# Patient Record
Sex: Female | Born: 1970
Health system: Southern US, Community
[De-identification: ages and names within clinical notes are randomized; demographics above are authoritative.]

## PROBLEM LIST (undated history)

## (undated) DIAGNOSIS — G43909 Migraine, unspecified, not intractable, without status migrainosus: Secondary | ICD-10-CM

## (undated) DIAGNOSIS — K219 Gastro-esophageal reflux disease without esophagitis: Secondary | ICD-10-CM

## (undated) DIAGNOSIS — J45909 Unspecified asthma, uncomplicated: Secondary | ICD-10-CM

## (undated) DIAGNOSIS — I10 Essential (primary) hypertension: Secondary | ICD-10-CM

## (undated) HISTORY — PX: ABDOMINAL HYSTERECTOMY: SHX81

## (undated) HISTORY — PX: ABLATION: SHX5711

## (undated) HISTORY — PX: FOOT SURGERY: SHX648

## (undated) HISTORY — PX: ROTATOR CUFF REPAIR: SHX139

## (undated) HISTORY — PX: TUBAL LIGATION: SHX77

## (undated) HISTORY — PX: LAPAROSCOPIC GASTRIC SLEEVE RESECTION: SHX5895

## (undated) HISTORY — PX: CHOLECYSTECTOMY: SHX55

---

## 2006-08-25 ENCOUNTER — Ambulatory Visit (HOSPITAL_COMMUNITY): Admission: RE | Admit: 2006-08-25 | Discharge: 2006-08-26 | Payer: Self-pay | Admitting: Orthopedic Surgery

## 2010-07-30 NOTE — Op Note (Signed)
Kerry Sloan, Kerry Sloan                ACCOUNT NO.:  [id number]   MEDICAL RECORD NO.:  [id number]          PATIENT TYPE:  OIB   LOCATION:  5035                         FACILITY:  [facility]   PHYSICIAN:  Feliberto Gottron. Turner Daniels, M.D.   DATE OF BIRTH:  29-Apr-1970   DATE OF PROCEDURE:  08/25/2006  DATE OF DISCHARGE:                               OPERATIVE REPORT   PREOPERATIVE DIAGNOSES:  1. 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  2. Partial thickness rotator cuff tear.   POSTOPERATIVE DIAGNOSES:  1. 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  2. Partial thickness rotator cuff tear.   PROCEDURE:  1. Left shoulder arthroscopic anterior-inferior acromioplasty.  2. Distal clavicle spur excision and debridement of external leaflet.  3. Partial thickness rotator cuff tear, maybe 10% of the fiber.   SURGEON:  Feliberto Gottron. Turner Daniels, M.D.   FIRST ASSISTANT:  None.   ANESTHESIA:  General endotracheal anesthesia.   ESTIMATED BLOOD LOSS:  Minimal.   FLUID REPLACEMENT:  1 L crystalloid.   DRAINS PLACED:  None.   TOURNIQUET TIME:  None.   INDICATIONS FOR PROCEDURE:  The patient is a 40 year old woman who was  initially seen by one my partners, Dr. Alveda Reasons for neck and shoulder pain.  Had a  differential injection into her left shoulder which gave her  dramatic pain relief for a short period of time.  Her neck did have some  degenerative changes in the cervical spine, but these were not felt to  be the major cause of her pain.  I saw her in the office and MRI scan  had been accomplished showing rotator cuff tendinosis, subacromial spur  that was fairly impressive actually, and a distal clavicle spur as well.  The rotator cuff had some evidence of partial thickness tearing along  the supraspinatus.  Options were discussed with the patient.  She  elected to have arthroscopic decompression of the left shoulder.  Risks  and  benefits of surgery were discussed.  She weighs about 285 pounds and  because of this, the surgery was done at the main hospital for her body  habitus.  She also a positive sleep study although she cannot tolerate  CPAP.   DESCRIPTION OF PROCEDURE:  The patient was identified by armband, taken  the operating room at [facility].  Appropriate anesthetic  monitors were attached and general endotracheal anesthesia induced with  the patient in the supine position.  She was then placed in the beach  chair position, left upper extremity prepped and draped in the usual  sterile fashion from the wrist to the hemithorax.  The skin along the  anterolateral posterior aspects of the acromion process was infiltrated  with 10 mL of 0.5%  Marcaine solution and then using a #11 blade,  standard portals were made 1.5 cm anterior to the [facility] joint lateral to  the junction of the middle and posterior thirds of the acromion,  posterior and posterolateral corner of the acromion process.  The inflow  was  placed anteriorly, the arthroscope laterally and a 4.2 great white  sucker shaver posteriorly.  Subacromial bursectomy was accomplished  outlining the subacromial spur and subclavicular spurs which were then  removed with a 4.5 hooded vortex bur.  We then visualized the external  leaflets of the rotator cuff which had some evidence of roughening, but  no full-thickness tearing.  About 5% of those fibers were debrided off  the insertion at the supraspinatus tubercle.  The arthroscope was then  repositioned into the glenohumeral joint where we documented an intact  biceps, biceps anchor, subscapularis, supraspinatus, and infraspinatus  insertions, and the articular cartilage were in good condition.  One  small loose body was removed from the glenohumeral joint that appeared  to be either a small piece of cartilage or some fibrous tissue.  No  donor site was located.  Photographic documentation was made of  these  findings and at this point the arthroscopic instruments were removed and  a dressing of Xeroform, 4x4 dressing sponges, ABD, and paper tape  applied.  The patient was laid supine, awakened and taken to recovery  room without difficulty.  She did receive 2 grams of Ancef IV  preoperatively      Feliberto Gottron. Turner Daniels, M.D.  Electronically Signed     FJR/MEDQ  D:  08/25/2006  T:  08/26/2006  Job:  [id number]

## 2011-01-02 LAB — BASIC METABOLIC PANEL
Chloride: 105
GFR calc Af Amer: >60
GFR calc non Af Amer: >60
Potassium: 3.9
Sodium: 137

## 2011-01-02 LAB — CBC
HCT: 35.3 — ABNORMAL LOW
MCV: 89.8
RBC: 3.93
WBC: 5

## 2011-01-02 LAB — APTT: aPTT: 30

## 2013-05-31 ENCOUNTER — Encounter (HOSPITAL_BASED_OUTPATIENT_CLINIC_OR_DEPARTMENT_OTHER): Payer: Self-pay | Admitting: Emergency Medicine

## 2013-05-31 ENCOUNTER — Emergency Department (HOSPITAL_BASED_OUTPATIENT_CLINIC_OR_DEPARTMENT_OTHER)
Admission: EM | Admit: 2013-05-31 | Discharge: 2013-06-01 | Disposition: A | Discharge status: Home / Self Care | Payer: No Typology Code available for payment source | Attending: Emergency Medicine | Admitting: Emergency Medicine

## 2013-05-31 DIAGNOSIS — S4980XA Other specified injuries of shoulder and upper arm, unspecified arm, initial encounter: Secondary | ICD-10-CM | POA: Insufficient documentation

## 2013-05-31 DIAGNOSIS — Y9389 Activity, other specified: Secondary | ICD-10-CM | POA: Insufficient documentation

## 2013-05-31 DIAGNOSIS — I10 Essential (primary) hypertension: Secondary | ICD-10-CM | POA: Insufficient documentation

## 2013-05-31 DIAGNOSIS — Z79899 Other long term (current) drug therapy: Secondary | ICD-10-CM | POA: Insufficient documentation

## 2013-05-31 DIAGNOSIS — Y92410 Unspecified street and highway as the place of occurrence of the external cause: Secondary | ICD-10-CM | POA: Insufficient documentation

## 2013-05-31 DIAGNOSIS — S8001XA Contusion of right knee, initial encounter: Secondary | ICD-10-CM

## 2013-05-31 DIAGNOSIS — J45909 Unspecified asthma, uncomplicated: Secondary | ICD-10-CM | POA: Insufficient documentation

## 2013-05-31 DIAGNOSIS — S46909A Unspecified injury of unspecified muscle, fascia and tendon at shoulder and upper arm level, unspecified arm, initial encounter: Secondary | ICD-10-CM | POA: Insufficient documentation

## 2013-05-31 DIAGNOSIS — K219 Gastro-esophageal reflux disease without esophagitis: Secondary | ICD-10-CM | POA: Insufficient documentation

## 2013-05-31 DIAGNOSIS — S46002A Unspecified injury of muscle(s) and tendon(s) of the rotator cuff of left shoulder, initial encounter: Secondary | ICD-10-CM

## 2013-05-31 DIAGNOSIS — Z9889 Other specified postprocedural states: Secondary | ICD-10-CM | POA: Insufficient documentation

## 2013-05-31 DIAGNOSIS — S8000XA Contusion of unspecified knee, initial encounter: Secondary | ICD-10-CM | POA: Insufficient documentation

## 2013-05-31 HISTORY — DX: Gastro-esophageal reflux disease without esophagitis: K21.9

## 2013-05-31 HISTORY — DX: Essential (primary) hypertension: I10

## 2013-05-31 HISTORY — DX: Unspecified asthma, uncomplicated: J45.909
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
## 2013-06-01 MED ORDER — NAPROXEN 250 MG PO TABS
500.0000 mg | ORAL_TABLET | Freq: Once | ORAL | Status: AC
  Administered 2013-06-01: 500 mg via ORAL
  Filled 2013-06-01: qty 2

## 2013-06-01 MED ORDER — HYDROCODONE-ACETAMINOPHEN 5-325 MG PO TABS: 1.0000 | ORAL_TABLET | Freq: Four times a day (QID) | ORAL | Status: DC | PRN
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
## 2013-12-11 ENCOUNTER — Encounter (HOSPITAL_BASED_OUTPATIENT_CLINIC_OR_DEPARTMENT_OTHER): Payer: Self-pay | Admitting: Emergency Medicine

## 2013-12-11 ENCOUNTER — Emergency Department (HOSPITAL_BASED_OUTPATIENT_CLINIC_OR_DEPARTMENT_OTHER)
Admission: EM | Admit: 2013-12-11 | Discharge: 2013-12-12 | Disposition: A | Discharge status: Home / Self Care | Payer: No Typology Code available for payment source | Attending: Emergency Medicine | Admitting: Emergency Medicine

## 2013-12-11 DIAGNOSIS — Z9851 Tubal ligation status: Secondary | ICD-10-CM | POA: Insufficient documentation

## 2013-12-11 DIAGNOSIS — Z9089 Acquired absence of other organs: Secondary | ICD-10-CM | POA: Diagnosis not present

## 2013-12-11 DIAGNOSIS — K219 Gastro-esophageal reflux disease without esophagitis: Secondary | ICD-10-CM | POA: Insufficient documentation

## 2013-12-11 DIAGNOSIS — Z79899 Other long term (current) drug therapy: Secondary | ICD-10-CM | POA: Diagnosis not present

## 2013-12-11 DIAGNOSIS — J45909 Unspecified asthma, uncomplicated: Secondary | ICD-10-CM | POA: Insufficient documentation

## 2013-12-11 DIAGNOSIS — R300 Dysuria: Secondary | ICD-10-CM | POA: Insufficient documentation

## 2013-12-11 DIAGNOSIS — I10 Essential (primary) hypertension: Secondary | ICD-10-CM | POA: Insufficient documentation

## 2013-12-11 DIAGNOSIS — Z9889 Other specified postprocedural states: Secondary | ICD-10-CM | POA: Diagnosis not present

## 2013-12-11 LAB — URINALYSIS, ROUTINE W REFLEX MICROSCOPIC
Bilirubin Urine: NEGATIVE
GLUCOSE, UA: NEGATIVE mg/dL
HGB URINE DIPSTICK: NEGATIVE
Ketones, ur: NEGATIVE mg/dL
Leukocytes, UA: NEGATIVE
Nitrite: NEGATIVE
PROTEIN: NEGATIVE mg/dL
Specific Gravity, Urine: 1.02 (ref 1.005–1.030)
Urobilinogen, UA: 1 mg/dL (ref 0.0–1.0)
pH: 8 (ref 5.0–8.0)

## 2013-12-11 LAB — PREGNANCY, URINE: Preg Test, Ur: NEGATIVE

## 2013-12-11 MED ORDER — CEPHALEXIN 500 MG PO CAPS
500.0000 mg | ORAL_CAPSULE | Freq: Four times a day (QID) | ORAL | Status: DC
Start: 2013-12-11 — End: 2015-05-27

## 2013-12-11 NOTE — ED Provider Notes (Signed)
CSN: [id number]     Arrival date & time 12/11/13  2041 History   First MD Initiated Contact with Patient 12/11/13 2217     Chief Complaint  Patient presents with  . Dysuria     (Consider location/radiation/quality/duration/timing/severity/associated sxs/prior Treatment) Patient is a 43 y.o. female presenting with dysuria. The history is provided by the patient. No language interpreter was used.  Dysuria Pain quality:  Unable to specify Pain severity:  Moderate Onset quality:  Gradual Duration:  1 day Timing:  Constant Progression:  Unchanged Chronicity:  New Recent urinary tract infections: no   Relieved by:  Nothing Worsened by:  Nothing tried Ineffective treatments:  None tried Urinary symptoms: frequent urination   Urinary symptoms: no discolored urine, no foul-smelling urine and no hematuria   Associated symptoms: no abdominal pain, no fever, no nausea, no vaginal discharge and no vomiting   Risk factors: sexually active   Risk factors: no hx of pyelonephritis, no hx of urolithiasis, no renal disease, no sexually transmitted infections and no urinary catheter     Past Medical History  Diagnosis Date  . Hypertension   . Asthma   . GERD (gastroesophageal reflux disease)    Past Surgical History  Procedure Laterality Date  . Cholecystectomy    . Tubal ligation    . Ablation    . Rotator cuff repair     No family history on file. History  Substance Use Topics  . Smoking status: Never Smoker   . Smokeless tobacco: Not on file  . Alcohol Use: Yes   OB History   Grav Para Term Preterm Abortions TAB SAB Ect Mult Living                 Review of Systems  Constitutional: Negative for fever, chills and fatigue.  HENT: Negative for trouble swallowing.   Eyes: Negative for visual disturbance.  Respiratory: Negative for shortness of breath.   Cardiovascular: Negative for chest pain and palpitations.  Gastrointestinal: Negative for nausea, vomiting, abdominal pain  and diarrhea.  Genitourinary: Positive for dysuria, urgency and frequency. Negative for vaginal discharge and difficulty urinating.  Musculoskeletal: Negative for arthralgias and neck pain.  Skin: Negative for color change.  Neurological: Negative for dizziness and weakness.  Psychiatric/Behavioral: Negative for dysphoric mood.      Allergies  Review of patient's allergies indicates no known allergies.  Home Medications   Prior to Admission medications   Medication Sig Start Date End Date Taking? Authorizing Provider  cetirizine (ZYRTEC) 10 MG tablet Take 10 mg by mouth daily.   Yes Historical Provider, MD  HYDROCHLOROTHIAZIDE PO Take by mouth.   Yes Historical Provider, MD  Montelukast Sodium (SINGULAIR PO) Take by mouth.   Yes Historical Provider, MD  omeprazole (PRILOSEC) 20 MG capsule Take 20 mg by mouth daily.   Yes Historical Provider, MD  HYDROcodone-acetaminophen (NORCO/VICODIN) 5-325 MG per tablet Take 1-2 tablets by mouth every 6 (six) hours as needed for moderate pain. 06/01/13   John L Molpus, MD  Lansoprazole (PREVACID PO) Take by mouth.    Historical Provider, MD   BP 152/89  Pulse 96  Temp(Src) 98.3 F (36.8 C) (Oral)  Resp 20  Ht  (1.651 m)  Wt 328 lb (148.78 kg)  BMI 54.58 kg/m2  SpO2 100%  LMP 12/04/2013 Physical Exam  Nursing note and vitals reviewed. Constitutional: She is oriented to person, place, and time. She appears well-developed and well-nourished. No distress.  HENT:  Head: Normocephalic and atraumatic.  Eyes: Conjunctivae are normal.  Neck: Normal range of motion.  Cardiovascular: Normal rate and regular rhythm.  Exam reveals no gallop and no friction rub.   No murmur heard. Pulmonary/Chest: Effort normal and breath sounds normal. She has no wheezes. She has no rales. She exhibits no tenderness.  Abdominal: Soft. She exhibits no distension. There is tenderness. There is no rebound.  Suprapubic tenderness to palpation.   Genitourinary:   No CVA tenderness.   Musculoskeletal: Normal range of motion.  Neurological: She is alert and oriented to person, place, and time. Coordination normal.  Speech is goal-oriented. Moves limbs without ataxia.   Skin: Skin is warm and dry.  Psychiatric: She has a normal mood and affect. Her behavior is normal.    ED Course  Procedures (including critical care time) Labs Review Labs Reviewed  URINE CULTURE  PREGNANCY, URINE  URINALYSIS, ROUTINE W REFLEX MICROSCOPIC    Imaging Review No results found.   EKG Interpretation None      MDM   Final diagnoses:  Dysuria   Patient's urinalysis shows no acute infection. Patient will have keflex per patient request. Vitals stable and patient afebrile.      Emilia Beck, [place] 12/13/13 [phone]

## 2013-12-11 NOTE — Discharge Instructions (Signed)
Take Keflex as directed for possible UTI. Refer to attached documents for more information. Return to the ED with worsening or concerning symptoms.

## 2013-12-11 NOTE — ED Notes (Signed)
PT presents to ED with complaints of painful urination that started today and lower abdominal pain

## 2013-12-13 NOTE — ED Provider Notes (Signed)
History/physical exam/procedure(s) were performed by non-physician practitioner and as supervising physician I was immediately available for consultation/collaboration. I have reviewed all notes and am in agreement with care and plan.   Hilario Quarryanielle S Morell Mears, MD 12/13/13 [phone]

## 2013-12-14 LAB — URINE CULTURE: Colony Count: 75000

## 2015-02-04 ENCOUNTER — Encounter (HOSPITAL_BASED_OUTPATIENT_CLINIC_OR_DEPARTMENT_OTHER): Payer: Self-pay | Admitting: Emergency Medicine

## 2015-02-04 ENCOUNTER — Emergency Department (HOSPITAL_BASED_OUTPATIENT_CLINIC_OR_DEPARTMENT_OTHER)
Admission: EM | Admit: 2015-02-04 | Discharge: 2015-02-04 | Disposition: A | Discharge status: Home / Self Care | Payer: No Typology Code available for payment source | Attending: Emergency Medicine | Admitting: Emergency Medicine

## 2015-02-04 DIAGNOSIS — Z79899 Other long term (current) drug therapy: Secondary | ICD-10-CM | POA: Insufficient documentation

## 2015-02-04 DIAGNOSIS — R51 Headache: Secondary | ICD-10-CM | POA: Insufficient documentation

## 2015-02-04 DIAGNOSIS — I10 Essential (primary) hypertension: Secondary | ICD-10-CM | POA: Insufficient documentation

## 2015-02-04 DIAGNOSIS — H109 Unspecified conjunctivitis: Secondary | ICD-10-CM | POA: Insufficient documentation

## 2015-02-04 DIAGNOSIS — K219 Gastro-esophageal reflux disease without esophagitis: Secondary | ICD-10-CM | POA: Insufficient documentation

## 2015-02-04 DIAGNOSIS — Z792 Long term (current) use of antibiotics: Secondary | ICD-10-CM | POA: Insufficient documentation

## 2015-02-04 DIAGNOSIS — J45909 Unspecified asthma, uncomplicated: Secondary | ICD-10-CM | POA: Insufficient documentation

## 2015-02-04 MED ORDER — GENTAMICIN SULFATE 0.3 % OP SOLN: 2.0000 [drp] | OPHTHALMIC | Status: DC

## 2015-02-04 NOTE — Discharge Instructions (Signed)
Gentamicin drops as prescribed.  Return to the emergency department if your symptoms significantly worsen or change.   Bacterial Conjunctivitis Bacterial conjunctivitis, commonly called pink eye, is an inflammation of the clear membrane that covers the white part of the eye (conjunctiva). The inflammation can also happen on the underside of the eyelids. The blood vessels in the conjunctiva become inflamed, causing the eye to become red or pink. Bacterial conjunctivitis may spread easily from one eye to another and from person to person (contagious).  CAUSES  Bacterial conjunctivitis is caused by bacteria. The bacteria may come from your own skin, your upper respiratory tract, or from someone else with bacterial conjunctivitis. SYMPTOMS  The normally white color of the eye or the underside of the eyelid is usually pink or red. The pink eye is usually associated with irritation, tearing, and some sensitivity to light. Bacterial conjunctivitis is often associated with a thick, yellowish discharge from the eye. The discharge may turn into a crust on the eyelids overnight, which causes your eyelids to stick together. If a discharge is present, there may also be some blurred vision in the affected eye. DIAGNOSIS  Bacterial conjunctivitis is diagnosed by your caregiver through an eye exam and the symptoms that you report. Your caregiver looks for changes in the surface tissues of your eyes, which may point to the specific type of conjunctivitis. A sample of any discharge may be collected on a cotton-tip swab if you have a severe case of conjunctivitis, if your cornea is affected, or if you keep getting repeat infections that do not respond to treatment. The sample will be sent to a lab to see if the inflammation is caused by a bacterial infection and to see if the infection will respond to antibiotic medicines. TREATMENT   Bacterial conjunctivitis is treated with antibiotics. Antibiotic eyedrops are most  often used. However, antibiotic ointments are also available. Antibiotics pills are sometimes used. Artificial tears or eye washes may ease discomfort. HOME CARE INSTRUCTIONS   To ease discomfort, apply a cool, clean washcloth to your eye for 10-20 minutes, 3-4 times a day.  Gently wipe away any drainage from your eye with a warm, wet washcloth or a cotton ball.  Wash your hands often with soap and water. Use paper towels to dry your hands.  Do not share towels or washcloths. This may spread the infection.  Change or wash your pillowcase every day.  You should not use eye makeup until the infection is gone.  Do not operate machinery or drive if your vision is blurred.  Stop using contact lenses. Ask your caregiver how to sterilize or replace your contacts before using them again. This depends on the type of contact lenses that you use.  When applying medicine to the infected eye, do not touch the edge of your eyelid with the eyedrop bottle or ointment tube. SEEK IMMEDIATE MEDICAL CARE IF:   Your infection has not improved within 3 days after beginning treatment.  You had yellow discharge from your eye and it returns.  You have increased eye pain.  Your eye redness is spreading.  Your vision becomes blurred.  You have a fever or persistent symptoms for more than 2-3 days.  You have a fever and your symptoms suddenly get worse.  You have facial pain, redness, or swelling. MAKE SURE YOU:   Understand these instructions.  Will watch your condition.  Will get help right away if you are not doing well or get worse.  This information is not intended to replace advice given to you by your health care provider. Make sure you discuss any questions you have with your health care provider.   Document Released: 03/03/2005 Document Revised: 03/24/2014 Document Reviewed: 08/04/2011 Elsevier Interactive Patient Education [employer]2016 [employer] Inc[employer]

## 2015-02-04 NOTE — ED Notes (Signed)
Patient states does wear corrective lenses, but forgot them this morning, stated she cannot see without them, then stated she drove herself to ED this a.m.

## 2015-02-04 NOTE — ED Notes (Signed)
Pt reports eye pain and tearing since yesterday. Denies feeling like anything is in her eye. No known contacts with eye infections. Normally wears glasses or contacts, but is not wearing either today. No edema, sclera red. No color to drainage.

## 2015-02-04 NOTE — ED Provider Notes (Signed)
CSN: [id number]     Arrival date & time 02/04/15  0631 History   First MD Initiated Contact with Patient 02/04/15 [phone]     Chief Complaint  Patient presents with  . Eye Pain     (Consider location/radiation/quality/duration/timing/severity/associated sxs/prior Treatment) HPI Comments: Patient is a 44 year old female with history of hypertension and asthma. She presents for evaluation of right eye redness and irritation that started yesterday. She reports having what she believed was a migraine prior to the onset. The headache has since resolved, however her eye is now red and irritated. She denies any visual disturbances.  Patient is a 44 y.o. female presenting with eye pain. The history is provided by the patient.  Eye Pain This is a new problem. The current episode started yesterday. The problem occurs constantly. The problem has been gradually worsening. Associated symptoms include headaches. Nothing aggravates the symptoms. She has tried nothing for the symptoms. The treatment provided no relief.    Past Medical History  Diagnosis Date  . Hypertension   . Asthma   . GERD (gastroesophageal reflux disease)    Past Surgical History  Procedure Laterality Date  . Cholecystectomy    . Tubal ligation    . Ablation    . Rotator cuff repair     No family history on file. Social History  Substance Use Topics  . Smoking status: Never Smoker   . Smokeless tobacco: None  . Alcohol Use: Yes   OB History    No data available     Review of Systems  Eyes: Positive for pain.  Neurological: Positive for headaches.  All other systems reviewed and are negative.     Allergies  Review of patient's allergies indicates no known allergies.  Home Medications   Prior to Admission medications   Medication Sig Start Date End Date Taking? Authorizing Provider  cephALEXin (KEFLEX) 500 MG capsule Take 1 capsule (500 mg total) by mouth 4 (four) times daily. 12/11/13  Yes Kaitlyn Szekalski,  PA-C  HYDROcodone-acetaminophen (NORCO/VICODIN) 5-325 MG per tablet Take 1-2 tablets by mouth every 6 (six) hours as needed for moderate pain. 06/01/13  Yes John Molpus, MD  Lansoprazole (PREVACID PO) Take by mouth.   Yes Historical Provider, MD  SUMAtriptan (IMITREX) 100 MG tablet Take 100 mg by mouth every 2 (two) hours as needed for migraine. May repeat in 2 hours if headache persists or recurs.   Yes Historical Provider, MD  cetirizine (ZYRTEC) 10 MG tablet Take 10 mg by mouth daily.    Historical Provider, MD  HYDROCHLOROTHIAZIDE PO Take by mouth.    Historical Provider, MD  Montelukast Sodium (SINGULAIR PO) Take by mouth.    Historical Provider, MD  omeprazole (PRILOSEC) 20 MG capsule Take 20 mg by mouth daily.    Historical Provider, MD   BP 137/84 mmHg  Pulse 81  Temp(Src) 97.6 F (36.4 C) (Oral)  Resp 20  Ht 5\' 5"  (1.651 m)  Wt 264 lb (119.75 kg)  BMI 43.93 kg/m2  SpO2 100%  LMP 02/02/2015 Physical Exam  Constitutional: She is oriented to person, place, and time. She appears well-developed and well-nourished. No distress.  HENT:  Head: Normocephalic and atraumatic.  Mouth/Throat: Oropharynx is clear and moist.  Eyes: EOM are normal. Pupils are equal, round, and reactive to light.  The right cornea appears clear. The conjunctiva is injected with slight discharge.  Neck: Normal range of motion. Neck supple.  Neurological: She is alert and oriented to person, place, and time.  Skin: Skin is warm and dry. She is not diaphoretic.  Nursing note and vitals reviewed.   ED Course  Procedures (including critical care time) Labs Review Labs Reviewed - No data to display  Imaging Review No results found. I have personally reviewed and evaluated these images and lab results as part of my medical decision-making.   EKG Interpretation None      MDM   Final diagnoses:  None    This appears to be a conjunctivitis. I will treat with gentamicin drops. She is to follow-up as  needed if she worsens.    Geoffery Lyons, MD 02/04/15 [phone]

## 2015-02-04 NOTE — ED Notes (Signed)
Pt teaching done re: using eye Rx as prescribed by EDP, enforced strict handwashing before and after using eye gtts. Pt able to return demonstration on eye med usage

## 2015-03-01 ENCOUNTER — Emergency Department (HOSPITAL_BASED_OUTPATIENT_CLINIC_OR_DEPARTMENT_OTHER)
Admission: EM | Admit: 2015-03-01 | Discharge: 2015-03-01 | Disposition: A | Discharge status: Home / Self Care | Payer: No Typology Code available for payment source | Attending: Emergency Medicine | Admitting: Emergency Medicine

## 2015-03-01 ENCOUNTER — Encounter (HOSPITAL_BASED_OUTPATIENT_CLINIC_OR_DEPARTMENT_OTHER): Payer: Self-pay | Admitting: *Deleted

## 2015-03-01 DIAGNOSIS — K219 Gastro-esophageal reflux disease without esophagitis: Secondary | ICD-10-CM | POA: Insufficient documentation

## 2015-03-01 DIAGNOSIS — Z79899 Other long term (current) drug therapy: Secondary | ICD-10-CM | POA: Insufficient documentation

## 2015-03-01 DIAGNOSIS — I10 Essential (primary) hypertension: Secondary | ICD-10-CM | POA: Insufficient documentation

## 2015-03-01 DIAGNOSIS — J45909 Unspecified asthma, uncomplicated: Secondary | ICD-10-CM | POA: Insufficient documentation

## 2015-03-01 DIAGNOSIS — M5431 Sciatica, right side: Secondary | ICD-10-CM | POA: Insufficient documentation

## 2015-03-01 DIAGNOSIS — Z792 Long term (current) use of antibiotics: Secondary | ICD-10-CM | POA: Insufficient documentation

## 2015-03-01 MED ORDER — PREDNISONE 10 MG (21) PO TBPK: 10.0000 mg | ORAL_TABLET | Freq: Every day | ORAL | Status: DC

## 2015-03-01 MED ORDER — IBUPROFEN 800 MG PO TABS: 800.0000 mg | ORAL_TABLET | Freq: Three times a day (TID) | ORAL | Status: DC

## 2015-03-01 MED ORDER — DEXAMETHASONE SODIUM PHOSPHATE 10 MG/ML IJ SOLN
10.0000 mg | Freq: Once | INTRAMUSCULAR | Status: AC
  Administered 2015-03-01: 10 mg via INTRAMUSCULAR
  Filled 2015-03-01: qty 1

## 2015-03-01 MED ORDER — HYDROCODONE-ACETAMINOPHEN 5-325 MG PO TABS: 2.0000 | ORAL_TABLET | ORAL | Status: DC | PRN

## 2015-03-01 MED ORDER — KETOROLAC TROMETHAMINE 60 MG/2ML IM SOLN
60.0000 mg | Freq: Once | INTRAMUSCULAR | Status: AC
  Administered 2015-03-01: 60 mg via INTRAMUSCULAR
  Filled 2015-03-01: qty 2

## 2015-03-01 NOTE — ED Notes (Signed)
Lower back pain radiating into her right leg. Same for a week. Not getting better.

## 2015-03-01 NOTE — Discharge Instructions (Signed)
Ms. Lavonia DraftsDana Vargo,  Nice meeting you! Please follow-up with your primary care provider as needed or if symptoms worsen. Return to the emergency department if you develop fevers, inability to walk, lose control of your bowel or bladder control. Feel better soon!  S. Lane HackerNicole Krystyne Tewksbury, PA-C

## 2015-03-01 NOTE — ED Provider Notes (Signed)
CSN: [id number]     Arrival date & time 03/01/15  1237 History   First MD Initiated Contact with Patient 03/01/15 1246     Chief Complaint: Back pain HPI Kerry Sloan is a 44 y.o. F PMH significant for HTN presenting with a one week history of lower back pain. She describes the pain as intermittent, worse with sitting, 9/10 pain scale, sharp, radiating down her right leg. She denies fevers, chills, history of this before, injury, loss of bladder/bowel control.   Past Medical History  Diagnosis Date  . Hypertension   . Asthma   . GERD (gastroesophageal reflux disease)    Past Surgical History  Procedure Laterality Date  . Cholecystectomy    . Tubal ligation    . Ablation    . Rotator cuff repair     No family history on file. Social History  Substance Use Topics  . Smoking status: Never Smoker   . Smokeless tobacco: None  . Alcohol Use: Yes   OB History    No data available     Review of Systems  Ten systems are reviewed and are negative for acute change except as noted in the HPI  Allergies  Review of patient's allergies indicates no known allergies.  Home Medications   Prior to Admission medications   Medication Sig Start Date End Date Taking? Authorizing Provider  cephALEXin (KEFLEX) 500 MG capsule Take 1 capsule (500 mg total) by mouth 4 (four) times daily. 12/11/13   Kaitlyn Szekalski, PA-C  cetirizine (ZYRTEC) 10 MG tablet Take 10 mg by mouth daily.    Historical Provider, MD  gentamicin (GARAMYCIN) 0.3 % ophthalmic solution Place 2 drops into both eyes every 4 (four) hours. 02/04/15   Veryl Speak, MD  HYDROCHLOROTHIAZIDE PO Take by mouth.    Historical Provider, MD  HYDROcodone-acetaminophen (NORCO/VICODIN) 5-325 MG tablet Take 2 tablets by mouth every 4 (four) hours as needed. 03/01/15   Titus Lions, PA-C  ibuprofen (ADVIL,MOTRIN) 800 MG tablet Take 1 tablet (800 mg total) by mouth 3 (three) times daily with meals. 03/01/15   [place] Lions, PA-C   Lansoprazole (PREVACID PO) Take by mouth.    Historical Provider, MD  Montelukast Sodium (SINGULAIR PO) Take by mouth.    Historical Provider, MD  omeprazole (PRILOSEC) 20 MG capsule Take 20 mg by mouth daily.    Historical Provider, MD  predniSONE (STERAPRED UNI-PAK 21 TAB) 10 MG (21) TBPK tablet Take 1 tablet (10 mg total) by mouth daily. Take 6 tabs by mouth daily  for 2 days, then 5 tabs for 2 days, then 4 tabs for 2 days, then 3 tabs for 2 days, 2 tabs for 2 days, then 1 tab by mouth daily for 2 days 03/01/15   [place] Lions, PA-C  SUMAtriptan (IMITREX) 100 MG tablet Take 100 mg by mouth every 2 (two) hours as needed for migraine. May repeat in 2 hours if headache persists or recurs.    Historical Provider, MD   BP 124/80 mmHg  Pulse 70  Temp(Src) 98.5 F (36.9 C) (Oral)  Resp 12  Ht 5' 5"  (1.651 m)  Wt 119.75 kg  BMI 43.93 kg/m2  SpO2 100%  LMP 02/02/2015 Physical Exam  Constitutional: She appears well-developed and well-nourished. No distress.  HENT:  Head: Normocephalic and atraumatic.  Mouth/Throat: Oropharynx is clear and moist. No oropharyngeal exudate.  Eyes: Conjunctivae are normal. Pupils are equal, round, and reactive to light. Right eye exhibits no discharge. Left eye exhibits  no discharge. No scleral icterus.  Neck: No tracheal deviation present.  Cardiovascular: Normal rate, regular rhythm, normal heart sounds and intact distal pulses.  Exam reveals no gallop and no friction rub.   No murmur heard. Pulmonary/Chest: Effort normal and breath sounds normal. No respiratory distress. She has no wheezes. She has no rales. She exhibits no tenderness.  Abdominal: Soft. Bowel sounds are normal. She exhibits no distension and no mass. There is no tenderness. There is no rebound and no guarding.  Musculoskeletal: Normal range of motion. She exhibits tenderness. She exhibits no edema.  Patient neurovascularly intact bilaterally. Tenderness over right SI joint   Lymphadenopathy:    She has no cervical adenopathy.  Neurological: She is alert. Coordination normal.  Skin: Skin is warm and dry. No rash noted. She is not diaphoretic. No erythema.  Psychiatric: She has a normal mood and affect. Her behavior is normal.  Nursing note and vitals reviewed.   ED Course  Procedures   MDM   Final diagnoses:  Sciatica of right side   Patient non-toxic appearing and VSS. Based on patient history and physical exam, most likely etiologies include sciatica vs strain. Less likely etiologies include ruptured AAA, pancreatitis, cholecystitis, ulcer, vertebral compression fracture, retroperitoneal bleed, spinal epidural abscess, discitis, osteomyelitis, pyelonephritis/perinephric abscess, cauda equina syndrome, herniated disc, spinal stenosis, fibromyalgia, rheumatoid arthritis, spondylitis, osteoarthritis, nephrolithiasis, multiple myeloma, bony metastasis. Imaging not indicated at this time.   Upon reevaluation, patient improved after toradol and decadron. Patient may be safely discharged home. Discussed reasons for return. Patient to follow-up with primary care provider or orthopedics within two weeks if symptoms do not improve. Patient in understanding and agreement with the plan.  [place] Lions, PA-C 03/04/15 4098  Sherwood Gambler, MD 03/09/15 1400

## 2015-05-27 ENCOUNTER — Emergency Department (HOSPITAL_BASED_OUTPATIENT_CLINIC_OR_DEPARTMENT_OTHER): Payer: No Typology Code available for payment source

## 2015-05-27 ENCOUNTER — Encounter (HOSPITAL_BASED_OUTPATIENT_CLINIC_OR_DEPARTMENT_OTHER): Payer: Self-pay

## 2015-05-27 ENCOUNTER — Emergency Department (HOSPITAL_BASED_OUTPATIENT_CLINIC_OR_DEPARTMENT_OTHER)
Admission: EM | Admit: 2015-05-27 | Discharge: 2015-05-27 | Disposition: A | Discharge status: Home / Self Care | Payer: No Typology Code available for payment source | Attending: Emergency Medicine | Admitting: Emergency Medicine

## 2015-05-27 DIAGNOSIS — S3992XA Unspecified injury of lower back, initial encounter: Secondary | ICD-10-CM | POA: Diagnosis not present

## 2015-05-27 DIAGNOSIS — M545 Low back pain, unspecified: Secondary | ICD-10-CM

## 2015-05-27 DIAGNOSIS — J45909 Unspecified asthma, uncomplicated: Secondary | ICD-10-CM | POA: Diagnosis not present

## 2015-05-27 DIAGNOSIS — Z79899 Other long term (current) drug therapy: Secondary | ICD-10-CM | POA: Diagnosis not present

## 2015-05-27 DIAGNOSIS — S8992XA Unspecified injury of left lower leg, initial encounter: Secondary | ICD-10-CM | POA: Insufficient documentation

## 2015-05-27 DIAGNOSIS — M25562 Pain in left knee: Secondary | ICD-10-CM

## 2015-05-27 DIAGNOSIS — Y9389 Activity, other specified: Secondary | ICD-10-CM | POA: Insufficient documentation

## 2015-05-27 DIAGNOSIS — Y998 Other external cause status: Secondary | ICD-10-CM | POA: Insufficient documentation

## 2015-05-27 DIAGNOSIS — I10 Essential (primary) hypertension: Secondary | ICD-10-CM | POA: Insufficient documentation

## 2015-05-27 DIAGNOSIS — K219 Gastro-esophageal reflux disease without esophagitis: Secondary | ICD-10-CM | POA: Diagnosis not present

## 2015-05-27 DIAGNOSIS — Y92410 Unspecified street and highway as the place of occurrence of the external cause: Secondary | ICD-10-CM | POA: Diagnosis not present

## 2015-05-27 MED ORDER — IBUPROFEN 800 MG PO TABS: 800.0000 mg | ORAL_TABLET | Freq: Three times a day (TID) | ORAL | Status: DC

## 2015-05-27 MED ORDER — IBUPROFEN 800 MG PO TABS
800.0000 mg | ORAL_TABLET | Freq: Once | ORAL | Status: AC
  Administered 2015-05-27: 800 mg via ORAL
  Filled 2015-05-27: qty 1

## 2015-05-27 NOTE — Discharge Instructions (Signed)
Take her medication as prescribed. I recommend eating food prior to taking ibuprofen to prevent GI side effects. You may also apply ice and heat to affected area for 15-20 minutes 3-4 times daily for pain relief. I recommend refraining from doing any heavy lifting, exercising or repetitive movements that exacerbate her symptoms. Please follow up with a primary care provider from the Resource Guide provided below in 4-5 days as needed. Please return to the Emergency Department if symptoms worsen or new onset of fever, redness, swelling, and numbness, tingling, saddle anesthesia, loss of bowel or bladder, weakness.
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
## 2015-11-17 ENCOUNTER — Emergency Department (HOSPITAL_BASED_OUTPATIENT_CLINIC_OR_DEPARTMENT_OTHER)
Admission: EM | Admit: 2015-11-17 | Discharge: 2015-11-17 | Disposition: A | Discharge status: Home / Self Care | Payer: BLUE CROSS/BLUE SHIELD | Attending: Emergency Medicine | Admitting: Emergency Medicine

## 2015-11-17 ENCOUNTER — Encounter (HOSPITAL_BASED_OUTPATIENT_CLINIC_OR_DEPARTMENT_OTHER): Payer: Self-pay

## 2015-11-17 DIAGNOSIS — Z79899 Other long term (current) drug therapy: Secondary | ICD-10-CM | POA: Insufficient documentation

## 2015-11-17 DIAGNOSIS — G43909 Migraine, unspecified, not intractable, without status migrainosus: Secondary | ICD-10-CM | POA: Insufficient documentation

## 2015-11-17 DIAGNOSIS — Z7951 Long term (current) use of inhaled steroids: Secondary | ICD-10-CM | POA: Diagnosis not present

## 2015-11-17 DIAGNOSIS — J45909 Unspecified asthma, uncomplicated: Secondary | ICD-10-CM | POA: Insufficient documentation

## 2015-11-17 DIAGNOSIS — R110 Nausea: Secondary | ICD-10-CM | POA: Insufficient documentation

## 2015-11-17 DIAGNOSIS — I10 Essential (primary) hypertension: Secondary | ICD-10-CM | POA: Diagnosis not present

## 2015-11-17 HISTORY — DX: Migraine, unspecified, not intractable, without status migrainosus: G43.909

## 2015-11-17 MED ORDER — SODIUM CHLORIDE 0.9 % IV SOLN
INTRAVENOUS | Status: DC
  Administered 2015-11-17: 19:00:00 via INTRAVENOUS

## 2015-11-17 MED ORDER — KETOROLAC TROMETHAMINE 30 MG/ML IJ SOLN
30.0000 mg | Freq: Once | INTRAMUSCULAR | Status: AC
  Administered 2015-11-17: 30 mg via INTRAVENOUS
  Filled 2015-11-17: qty 1

## 2015-11-17 MED ORDER — DIPHENHYDRAMINE HCL 50 MG/ML IJ SOLN
12.5000 mg | Freq: Once | INTRAMUSCULAR | Status: AC
  Administered 2015-11-17: 12.5 mg via INTRAVENOUS
  Filled 2015-11-17: qty 1

## 2015-11-17 MED ORDER — SUMATRIPTAN SUCCINATE 100 MG PO TABS: 100.0000 mg | ORAL_TABLET | ORAL | 0 refills | Status: DC | PRN

## 2015-11-17 MED ORDER — PROCHLORPERAZINE EDISYLATE 5 MG/ML IJ SOLN
10.0000 mg | Freq: Once | INTRAMUSCULAR | Status: AC
  Administered 2015-11-17: 10 mg via INTRAVENOUS
  Filled 2015-11-17: qty 2

## 2015-11-17 NOTE — Discharge Instructions (Signed)
Follow up with your primary care doctor.

## 2015-11-17 NOTE — ED Notes (Signed)
Pt seen by EDP, and treated/ medicated prior to RN assessment, see MD notes, orders received and initiated. Care assumed at this time.

## 2015-11-17 NOTE — ED Provider Notes (Signed)
[facility] Note   CSN: [id number] Arrival date & time: 11/17/15  1756 By signing my name below, I, Levon Hedger, attest that this documentation has been prepared under the direction and in the presence of Linwood Dibbles, MD . Electronically Signed: Levon Hedger, Scribe. 11/17/2015. 6:51 PM.   History   Chief Complaint Chief Complaint  Patient presents with  . Migraine    HPI Kerry Sloan is a 45 y.o. female with hx of migraine and HTN who presents to the Emergency Department complaining of severe migraine onset last night. She rates her pain as 10/10.  Her symptoms are typical for her migraines. Pt states she is out of Imitrex which she typically takes for her migraines. Pt has appointment with PCP at [facility] in three days. She notes associated nausea, but denies vomiting, fevers, or chills. NKDA.   The history is provided by the patient. No language interpreter was used.    Past Medical History:  Diagnosis Date  . Asthma   . GERD (gastroesophageal reflux disease)   . Hypertension   . Migraine     There are no active problems to display for this patient.   Past Surgical History:  Procedure Laterality Date  . ABLATION    . CHOLECYSTECTOMY    . ROTATOR CUFF REPAIR    . TUBAL LIGATION      OB History    No data available      Home Medications    Prior to Admission medications   Medication Sig Start Date End Date Taking? Authorizing Provider  ALBUTEROL IN Inhale into the lungs.   Yes Historical Provider, MD  fluticasone (FLONASE) 50 MCG/ACT nasal spray Place into both nostrils daily.   Yes Historical Provider, MD  Topiramate (TOPAMAX PO) Take by mouth.   Yes Historical Provider, MD  cetirizine (ZYRTEC) 10 MG tablet Take 10 mg by mouth daily.    Historical Provider, MD  HYDROCHLOROTHIAZIDE PO Take by mouth.    Historical Provider, MD  ibuprofen (ADVIL,MOTRIN) 800 MG tablet Take 1 tablet (800 mg total) by mouth 3 (three) times daily. 05/27/15    Barrett Henle, PA-C  Montelukast Sodium (SINGULAIR PO) Take by mouth.    Historical Provider, MD  omeprazole (PRILOSEC) 20 MG capsule Take 20 mg by mouth daily.    Historical Provider, MD  SUMAtriptan (IMITREX) 100 MG tablet Take 1 tablet (100 mg total) by mouth every 2 (two) hours as needed for migraine. May repeat in 2 hours if headache persists or recurs. 11/17/15   Linwood Dibbles, MD    Family History No family history on file.  Social History Social History  Substance Use Topics  . Smoking status: Never Smoker  . Smokeless tobacco: Never Used  . Alcohol use Yes     Comment: occ     Allergies   Review of patient's allergies indicates no known allergies.   Review of Systems Review of Systems  Constitutional: Negative for chills and fever.  Gastrointestinal: Positive for nausea. Negative for vomiting.  Neurological: Positive for headaches.  All other systems reviewed and are negative.  Physical Exam Updated Vital Signs BP 144/85 (BP Location: Left Arm)   Pulse 79   Temp 98.4 F (36.9 C) (Oral)   Resp 18   Ht 5\' 5"  (1.651 m)   Wt (!) 140.6 kg   LMP 11/13/2015   SpO2 100%   BMI 51.59 kg/m   Physical Exam  Constitutional: She appears well-developed and well-nourished. No distress.  HENT:  Head: Normocephalic and atraumatic.  Right Ear: External ear normal.  Left Ear: External ear normal.  Eyes: Conjunctivae are normal. Right eye exhibits no discharge. Left eye exhibits no discharge. No scleral icterus.  Neck: Neck supple. No tracheal deviation present.  Cardiovascular: Normal rate, regular rhythm and intact distal pulses.   Pulmonary/Chest: Effort normal and breath sounds normal. No stridor. No respiratory distress. She has no wheezes. She has no rales.  Abdominal: Soft. Bowel sounds are normal. She exhibits no distension. There is no tenderness. There is no rebound and no guarding.  Musculoskeletal: She exhibits no edema or tenderness.  Neurological: She is  alert. She has normal strength. No cranial nerve deficit (no facial droop, extraocular movements intact, no slurred speech) or sensory deficit. She exhibits normal muscle tone. She displays no seizure activity. Coordination normal.  Skin: Skin is warm and dry. No rash noted.  Psychiatric: She has a normal mood and affect.  Nursing note and vitals reviewed.    ED Treatments / Results  DIAGNOSTIC STUDIES:  Oxygen Saturation is 100% on RA, normal by my interpretation.    COORDINATION OF CARE:  6:39 PM Will order Toradol, benadryl, and compazine. Discussed treatment plan with pt at bedside and pt agreed to plan.   Procedures Procedures (including critical care time)  Medications Ordered in ED Medications  0.9 %  sodium chloride infusion ( Intravenous New Bag/Given 11/17/15 1851)  prochlorperazine (COMPAZINE) injection 10 mg (10 mg Intravenous Given 11/17/15 1903)  ketorolac (TORADOL) 30 MG/ML injection 30 mg (30 mg Intravenous Given 11/17/15 1901)  diphenhydrAMINE (BENADRYL) injection 12.5 mg (12.5 mg Intravenous Given 11/17/15 1859)    Initial Impression / Assessment and Plan / ED Course  I have reviewed the triage vital signs and the nursing notes.  Pertinent labs & imaging results that were available during my care of the patient were reviewed by me and considered in my medical decision making (see chart for details).  Clinical Course  Comment By Time  Patient is feeling better and is ready to go home Linwood DibblesJon Kaito Schulenburg, MD 09/02 2029   Patient's symptoms are suggestive of a migraine headache. No findings to suggest stroke, meningitis, encephalitis or hemorrhage. Patient improved after treatment in the emergency room with a migraine cocktail.  Final Clinical Impressions(s) / ED Diagnoses   Final diagnoses:  Migraine without status migrainosus, not intractable, unspecified migraine type    New Prescriptions Current Discharge Medication List  Imitrex  I personally performed the services  described in this documentation, which was scribed in my presence.  The recorded information has been reviewed and is accurate.     Linwood DibblesJon Coti Burd, MD 11/17/15 2029

## 2015-11-17 NOTE — ED Triage Notes (Signed)
C/o migraine, nausea started yesterday-NAD

## 2015-11-17 NOTE — ED Notes (Signed)
Pt ambulatory back from b/r, alert, NAD, calm, interactive, steady gait, "feel better", denies HA at this time, or other sx.

## 2016-05-08 ENCOUNTER — Encounter (HOSPITAL_BASED_OUTPATIENT_CLINIC_OR_DEPARTMENT_OTHER): Payer: Self-pay | Admitting: *Deleted

## 2016-05-08 ENCOUNTER — Emergency Department (HOSPITAL_BASED_OUTPATIENT_CLINIC_OR_DEPARTMENT_OTHER)
Admission: EM | Admit: 2016-05-08 | Discharge: 2016-05-08 | Disposition: A | Discharge status: Home / Self Care | Payer: BLUE CROSS/BLUE SHIELD | Attending: Emergency Medicine | Admitting: Emergency Medicine

## 2016-05-08 DIAGNOSIS — J45909 Unspecified asthma, uncomplicated: Secondary | ICD-10-CM | POA: Diagnosis not present

## 2016-05-08 DIAGNOSIS — I10 Essential (primary) hypertension: Secondary | ICD-10-CM | POA: Diagnosis not present

## 2016-05-08 DIAGNOSIS — M545 Low back pain: Secondary | ICD-10-CM | POA: Diagnosis present

## 2016-05-08 DIAGNOSIS — Z79899 Other long term (current) drug therapy: Secondary | ICD-10-CM | POA: Diagnosis not present

## 2016-05-08 DIAGNOSIS — M5441 Lumbago with sciatica, right side: Secondary | ICD-10-CM | POA: Insufficient documentation

## 2016-05-08 DIAGNOSIS — M5431 Sciatica, right side: Secondary | ICD-10-CM

## 2016-05-08 MED ORDER — METHOCARBAMOL 500 MG PO TABS: 500.0000 mg | ORAL_TABLET | Freq: Three times a day (TID) | ORAL | 0 refills | Status: DC | PRN

## 2016-05-08 MED ORDER — PREDNISONE 20 MG PO TABS
40.0000 mg | ORAL_TABLET | Freq: Once | ORAL | Status: AC
  Administered 2016-05-08: 40 mg via ORAL
  Filled 2016-05-08: qty 2

## 2016-05-08 MED ORDER — TRAMADOL HCL 50 MG PO TABS: 50.0000 mg | ORAL_TABLET | Freq: Four times a day (QID) | ORAL | 0 refills | Status: DC | PRN

## 2016-05-08 MED ORDER — PREDNISONE 20 MG PO TABS: 40.0000 mg | ORAL_TABLET | Freq: Every day | ORAL | 0 refills | Status: DC

## 2016-05-08 NOTE — ED Triage Notes (Signed)
Back pain with radiation down her right leg. Hx of sciatica the last time she had this same pain.

## 2016-05-08 NOTE — ED Provider Notes (Signed)
[facility] Provider Note   CSN: [id number] Arrival date & time: 05/08/16  2005  By signing my name below, I, Nelwyn SalisburyJoshua Fowler, attest that this documentation has been prepared under the direction and in the presence of Benjiman CoreNathan Willmar Stockinger, MD . Electronically Signed: Nelwyn SalisburyJoshua Fowler, Scribe. 05/08/2016. 8:55 PM.  History   Chief Complaint Chief Complaint  Patient presents with  . Back Pain   The history is provided by the patient. No language interpreter was used.   HPI Comments:  Kerry Sloan is a 46 y.o. female with pmhx of sciatica who presents to the Emergency Department complaining of constant, mild-moderate back pain onset 5 days ago. Pt describes her symptoms as a shooting pain that starts in her lower back and radiates down her legs, bilaterally. She states that her current symptoms are similar to sciatica exacerbations she has experienced in the past. 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 She also denies any dysuria, fevers or chills.   Past Medical History:  Diagnosis Date  . Asthma   . GERD (gastroesophageal reflux disease)   . Hypertension   . Migraine     There are no active problems to display for this patient.   Past Surgical History:  Procedure Laterality Date  . ABLATION    . CHOLECYSTECTOMY    . ROTATOR CUFF REPAIR    . TUBAL LIGATION      OB History    No data available       Home Medications    Prior to Admission medications   Medication Sig Start Date End Date Taking? Authorizing Provider  ALBUTEROL IN Inhale into the lungs.    Historical Provider, MD  cetirizine (ZYRTEC) 10 MG tablet Take 10 mg by mouth daily.    Historical Provider, MD  fluticasone (FLONASE) 50 MCG/ACT nasal spray Place into both nostrils daily.    Historical Provider, MD  HYDROCHLOROTHIAZIDE PO Take by mouth.    Historical Provider, MD  ibuprofen (ADVIL,MOTRIN) 800 MG tablet Take 1 tablet (800 mg  total) by mouth 3 (three) times daily. 05/27/15   Barrett HenleNicole Elizabeth Nadeau, PA-C  methocarbamol (ROBAXIN) 500 MG tablet Take 1 tablet (500 mg total) by mouth every 8 (eight) hours as needed for muscle spasms. 05/08/16   Benjiman CoreNathan Najma Bozarth, MD  Montelukast Sodium (SINGULAIR PO) Take by mouth.    Historical Provider, MD  omeprazole (PRILOSEC) 20 MG capsule Take 20 mg by mouth daily.    Historical Provider, MD  predniSONE (DELTASONE) 20 MG tablet Take 2 tablets (40 mg total) by mouth daily. 05/09/16   Benjiman CoreNathan Sharin Altidor, MD  SUMAtriptan (IMITREX) 100 MG tablet Take 1 tablet (100 mg total) by mouth every 2 (two) hours as needed for migraine. May repeat in 2 hours if headache persists or recurs. 11/17/15   Linwood DibblesJon Knapp, MD  Topiramate (TOPAMAX PO) Take by mouth.    Historical Provider, MD    Family History No family history on file.  Social History Social History  Substance Use Topics  . Smoking status: Never Smoker  . Smokeless tobacco: Never Used  . Alcohol use Yes     Comment: occ     Allergies   Patient has no known allergies.   Review of Systems Review of Systems  Constitutional: Negative for chills and fever.  Genitourinary: Negative for dysuria.  Musculoskeletal: Positive for back pain.  All other systems reviewed and are negative.  Physical Exam Updated Vital Signs BP (!) 160/101   Pulse 94   Temp 98.3 F (36.8 C) (Oral)   Resp 20   Ht 5\' 5"  (1.651 m)   Wt (!) 310 lb (140.6 kg)   LMP 04/17/2016   SpO2 100%   BMI 51.59 kg/m   Physical Exam  Constitutional: She is oriented to person, place, and time. She appears well-developed and well-nourished. No distress.  Obese  HENT:  Head: Normocephalic and atraumatic.  Eyes: Conjunctivae are normal.  Cardiovascular: Normal rate.   Pulmonary/Chest: Effort normal.  Abdominal: She exhibits no distension.  Musculoskeletal: She exhibits tenderness.  Lower lumbar tenderness worse on right side. Pain with leg raise. Good sensation.  Good flexion and extension of ankle. Perineal sensation intact.   Neurological: She is alert and oriented to person, place, and time.  Skin: Skin is warm and dry.  Psychiatric: She has a normal mood and affect.  Nursing note and vitals reviewed.    ED Treatments / Results   COORDINATION OF CARE:  9:02 PM Discussed treatment plan with pt at bedside which includes muscle relaxants and steroids and pt agreed to plan.  Labs (all labs ordered are listed, but only abnormal results are displayed) Labs Reviewed - No data to display  EKG  EKG Interpretation None       Radiology No results found.  Procedures Procedures (including critical care time)  Medications Ordered in ED Medications  predniSONE (DELTASONE) tablet 40 mg (40 mg Oral Given 05/08/16 2112)     Initial Impression / Assessment and Plan / ED Course  I have reviewed the triage vital signs and the nursing notes.  Pertinent labs & imaging results that were available during my care of the patient were reviewed by me and considered in my medical decision making (see chart for details).   patient with back pain radiating down leg. Previous history of sciatica. No red flags. Will discharge home with some muscle relaxers and steroids.  Final Clinical Impressions(s) / ED Diagnoses   Final diagnoses:  Sciatica of right side    New Prescriptions New Prescriptions   METHOCARBAMOL (ROBAXIN) 500 MG TABLET    Take 1 tablet (500 mg total) by mouth every 8 (eight) hours as needed for muscle spasms.   PREDNISONE (DELTASONE) 20 MG TABLET    Take 2 tablets (40 mg total) by mouth daily.  I personally performed the services described in this documentation, which was scribed in my presence. The recorded information has been reviewed and is accurate.       Benjiman Core, MD 05/08/16 2155

## 2016-10-07 ENCOUNTER — Emergency Department (HOSPITAL_BASED_OUTPATIENT_CLINIC_OR_DEPARTMENT_OTHER): Payer: BLUE CROSS/BLUE SHIELD

## 2016-10-07 ENCOUNTER — Emergency Department (HOSPITAL_BASED_OUTPATIENT_CLINIC_OR_DEPARTMENT_OTHER)
Admission: EM | Admit: 2016-10-07 | Discharge: 2016-10-07 | Disposition: A | Discharge status: Home / Self Care | Payer: BLUE CROSS/BLUE SHIELD | Attending: Emergency Medicine | Admitting: Emergency Medicine

## 2016-10-07 ENCOUNTER — Encounter (HOSPITAL_BASED_OUTPATIENT_CLINIC_OR_DEPARTMENT_OTHER): Payer: Self-pay

## 2016-10-07 DIAGNOSIS — R600 Localized edema: Secondary | ICD-10-CM | POA: Insufficient documentation

## 2016-10-07 DIAGNOSIS — I10 Essential (primary) hypertension: Secondary | ICD-10-CM | POA: Diagnosis not present

## 2016-10-07 DIAGNOSIS — R2241 Localized swelling, mass and lump, right lower limb: Secondary | ICD-10-CM | POA: Diagnosis present

## 2016-10-07 DIAGNOSIS — J45909 Unspecified asthma, uncomplicated: Secondary | ICD-10-CM | POA: Diagnosis not present

## 2016-10-07 DIAGNOSIS — R609 Edema, unspecified: Secondary | ICD-10-CM

## 2016-10-07 DIAGNOSIS — Z79899 Other long term (current) drug therapy: Secondary | ICD-10-CM | POA: Insufficient documentation

## 2016-10-07 MED ORDER — FUROSEMIDE 20 MG PO TABS: 40.0000 mg | ORAL_TABLET | Freq: Every day | ORAL | 0 refills | Status: DC

## 2016-10-07 NOTE — ED Notes (Signed)
Pt in [place] at this time 

## 2016-10-07 NOTE — ED Provider Notes (Signed)
[facility]   CSN: [id number] Arrival date & time: 10/07/16  1946  By signing my name below, I, Thelma Bargeick Cochran, attest that this documentation has been prepared under the direction and in the presence of Avonne Berkery, PA-C. Electronically Signed: Thelma BargeNick Cochran, Scribe. 10/07/16. 9:34 PM.  History   Chief Complaint Chief Complaint  Patient presents with  . Leg Swelling   The history is provided by the patient. No language interpreter was used.    HPI Comments: Kerry Sloan is a 46 y.o. female with PMHx of migraines who presents to the Emergency Department complaining of constant, gradually worsening right-sided leg pain and swelling for 2 days. She states her pain is mild and she describes this as a tightness and is painful and uncomfortable when her leg is in different positions. She also states her pain is more noticeable when she sits or lies down. Pt reports a recent foot surgery 2 weeks ago and is seeing her surgeon tomorrow. She walks with a post op shoe. Pt denies numbness/tingling in her foot, CP, SOB, difficulty breathing, abdominal pain, nausea, vomiting, and HA. She further denies smoking, h/o drug abuse, h/o blood clot, pertinent heart disease, or use of hormones. Pt takes Topamax for her migraines. Pt has concerns of a blood clot and states she wanted to get this checked out. Pt has a FMHx of CA.  Past Medical History:  Diagnosis Date  . Asthma   . GERD (gastroesophageal reflux disease)   . Hypertension   . Migraine     There are no active problems to display for this patient.   Past Surgical History:  Procedure Laterality Date  . ABLATION    . CHOLECYSTECTOMY    . FOOT SURGERY    . ROTATOR CUFF REPAIR    . TUBAL LIGATION      OB History    No data available       Home Medications    Prior to Admission medications   Medication Sig Start Date End Date Taking? Authorizing Provider  ALBUTEROL IN Inhale into the lungs.    [provider]  cetirizine (ZYRTEC) 10 MG tablet Take 10 mg by mouth daily.    [provider]  fluticasone (FLONASE) 50 MCG/ACT nasal spray Place into both nostrils daily.    [provider]  furosemide (LASIX) 20 MG tablet Take 2 tablets (40 mg total) by mouth daily. 10/07/16 10/12/16  Rithvik Orcutt, PA-C  ibuprofen (ADVIL,MOTRIN) 800 MG tablet Take 1 tablet (800 mg total) by mouth 3 (three) times daily. 05/27/15   Barrett HenleNadeau, Nicole Elizabeth, PA-C  Montelukast Sodium (SINGULAIR PO) Take by mouth.    [provider]  omeprazole (PRILOSEC) 20 MG capsule Take 20 mg by mouth daily.    [provider]  SUMAtriptan (IMITREX) 100 MG tablet Take 1 tablet (100 mg total) by mouth every 2 (two) hours as needed for migraine. May repeat in 2 hours if headache persists or recurs. 11/17/15   Linwood DibblesKnapp, Jon, MD  Topiramate (TOPAMAX PO) Take by mouth.    [provider]    Family History No family history on file.  Social History Social History  Substance Use Topics  . Smoking status: Never Smoker  . Smokeless tobacco: Never Used  . Alcohol use Yes     Comment: occ     Allergies   Patient has no known allergies.   Review of Systems Review of Systems  Respiratory: Negative for shortness of breath.  Cardiovascular: Negative for chest pain.  Gastrointestinal: Negative for abdominal pain, nausea and vomiting.  Musculoskeletal: Positive for joint swelling and myalgias.  Neurological: Negative for numbness and headaches.     Physical Exam Updated Vital Signs BP (!) 142/86 (BP Location: Right Arm)   Pulse 94   Temp 99 F (37.2 C) (Oral)   Resp 20   Ht 5\' 5"  (1.651 m)   Wt (!) 145.2 kg (320 lb)   LMP 09/30/2016   SpO2 100%   BMI 53.25 kg/m   Physical Exam  Constitutional: She is oriented to person, place, and time. She appears well-developed and well-nourished. No distress.  HENT:  Head: Normocephalic.  Eyes: EOM are normal.  Neck: Normal range  of motion.  Cardiovascular: Normal rate, regular rhythm and intact distal pulses.   Pulmonary/Chest: Effort normal and breath sounds normal. No respiratory distress.  Abdominal: She exhibits no distension.  Musculoskeletal: Normal range of motion.  Neurological: She is alert and oriented to person, place, and time.  Skin: Skin is warm.  Increased edema of the right lower leg. Patient reports tenderness to palpation of the posterior calf. No obvious redness. Pulses intact bilaterally. Color and warmth equal bilaterally. Strength and sensation intact bilaterally.  Psychiatric: She has a normal mood and affect.  Nursing note and vitals reviewed.    ED Treatments / Results  COORDINATION OF CARE: 9:33 PM Discussed treatment plan with pt at bedside and pt agreed to plan.  Labs (all labs ordered are listed, but only abnormal results are displayed) Labs Reviewed - No data to display  EKG  EKG Interpretation None       Radiology US Venous Img Lower Unilateral Right  Result Date: 10/07/2016 CLINICAL DATA:  Right lower extremity swelling for 2 days. EXAM: Right LOWER EXTREMITY VENOUS DOPPLER ULTRASOUND TECHNIQUE: Gray-scale sonography with graded compression, as well as color Doppler and duplex ultrasound were performed to evaluate the lower extremity deep venous systems from the level of the common femoral vein and including the common femoral, femoral, profunda femoral, popliteal and calf veins including the posterior tibial, peroneal and gastrocnemius veins when visible. The superficial great saphenous vein was also interrogated. Spectral Doppler was utilized to evaluate flow at rest and with distal augmentation maneuvers in the common femoral, femoral and popliteal veins. COMPARISON:  None. FINDINGS: Contralateral Common Femoral Vein: Respiratory phasicity is normal and symmetric with the symptomatic side. No evidence of thrombus. Normal compressibility. Common Femoral Vein: No evidence of  thrombus. Normal compressibility, respiratory phasicity and response to augmentation. Saphenofemoral Junction: No evidence of thrombus. Normal compressibility and flow on color Doppler imaging. Profunda Femoral Vein: No evidence of thrombus. Normal compressibility and flow on color Doppler imaging. Femoral Vein: No evidence of thrombus. Normal compressibility, respiratory phasicity and response to augmentation. Popliteal Vein: No evidence of thrombus. Normal compressibility, respiratory phasicity and response to augmentation. Calf Veins: No evidence of thrombus. Normal compressibility and flow on color Doppler imaging. Superficial Great Saphenous Vein: No evidence of thrombus. Normal compressibility and flow on color Doppler imaging. Venous Reflux:  None. Other Findings:  None. IMPRESSION: No evidence of DVT within the right lower extremity. Electronically Signed   By: Ellery Plunk M.D.   On: 10/07/2016 21:11    Procedures Procedures (including critical care time)  Medications Ordered in ED Medications - No data to display   Initial Impression / Assessment and Plan / ED Course  I have reviewed the triage vital signs and the nursing notes.  Pertinent labs &  imaging results that were available during my care of the patient were reviewed by me and considered in my medical decision making (see chart for details).     Patient presenting with right lower leg swelling and tightness 2 days. Patient with recent surgery, and no other risk factors for DVT. Physical exam shows increased swelling of the right lower leg without erythema. Patient neurovascularly intact, pulses intact bilaterally. DVT study negative. Patient with likely increased edema due to surgery and decreased use of her leg. At this time, low suspicion for edema due to cardiac cause. Discussed findings with patient. Will give short course of Lasix, and discussed patient can try to improve symptoms with elevating her leg and use  compression socks. Patient does continue to follow-up with surgeon tomorrow. Patient follow-up with primary care if symptoms do not improve. Return precautions given. Patient states she understands and agrees to plan.  Final Clinical Impressions(s) / ED Diagnoses   Final diagnoses:  Peripheral edema    New Prescriptions Discharge Medication List as of 10/07/2016  9:47 PM    START taking these medications   Details  furosemide (LASIX) 20 MG tablet Take 2 tablets (40 mg total) by mouth daily., Starting Tue 10/07/2016, Until Sun 10/12/2016, Print      I personally performed the services described in this documentation, which was scribed in my presence. The recorded information has been reviewed and is accurate.    Alveria Apley, PA-C 10/08/16 0119    Alveria Apley, PA-C 10/08/16 0127    Arby Barrette, MD 10/08/16 [phone]

## 2016-10-07 NOTE — ED Triage Notes (Signed)
C/o swelling to right LE x 2 days-foot surgery 09/25/16-pt is wearing cam walker-has 2nd post op f/u tomorrow-NAD-steady gait

## 2016-10-07 NOTE — Discharge Instructions (Signed)
Use Lasix daily as prescribed. Try to elevate your leg whenever possible. You may use compression socks to help improve leg swelling. Follow-up with your primary care in 1 week if swelling has not improved. Return to the emergency department if you develop fever, chills, chest pain, shortness of breath, or any new or worsening symptoms.

## 2016-10-07 NOTE — ED Notes (Signed)
Discussed case with EDP-orders received  

## 2017-05-29 ENCOUNTER — Other Ambulatory Visit: Payer: Self-pay

## 2017-05-29 ENCOUNTER — Encounter (HOSPITAL_BASED_OUTPATIENT_CLINIC_OR_DEPARTMENT_OTHER): Payer: Self-pay

## 2017-05-29 ENCOUNTER — Emergency Department (HOSPITAL_BASED_OUTPATIENT_CLINIC_OR_DEPARTMENT_OTHER)
Admission: EM | Admit: 2017-05-29 | Discharge: 2017-05-29 | Disposition: A | Discharge status: Home / Self Care | Payer: BLUE CROSS/BLUE SHIELD | Attending: Emergency Medicine | Admitting: Emergency Medicine

## 2017-05-29 DIAGNOSIS — B349 Viral infection, unspecified: Secondary | ICD-10-CM | POA: Diagnosis not present

## 2017-05-29 DIAGNOSIS — J3489 Other specified disorders of nose and nasal sinuses: Secondary | ICD-10-CM | POA: Diagnosis present

## 2017-05-29 DIAGNOSIS — J069 Acute upper respiratory infection, unspecified: Secondary | ICD-10-CM | POA: Diagnosis not present

## 2017-05-29 DIAGNOSIS — B9789 Other viral agents as the cause of diseases classified elsewhere: Secondary | ICD-10-CM

## 2017-05-29 DIAGNOSIS — Z79899 Other long term (current) drug therapy: Secondary | ICD-10-CM | POA: Insufficient documentation

## 2017-05-29 DIAGNOSIS — I10 Essential (primary) hypertension: Secondary | ICD-10-CM | POA: Insufficient documentation

## 2017-05-29 DIAGNOSIS — J45909 Unspecified asthma, uncomplicated: Secondary | ICD-10-CM | POA: Insufficient documentation

## 2017-05-29 DIAGNOSIS — R05 Cough: Secondary | ICD-10-CM | POA: Diagnosis not present

## 2017-05-29 MED ORDER — LORATADINE 10 MG PO TABS: 10.0000 mg | ORAL_TABLET | Freq: Every day | ORAL | 0 refills | Status: DC

## 2017-05-29 MED ORDER — ALBUTEROL SULFATE HFA 108 (90 BASE) MCG/ACT IN AERS: 1.0000 | INHALATION_SPRAY | Freq: Four times a day (QID) | RESPIRATORY_TRACT | 0 refills | Status: AC | PRN

## 2017-05-29 MED ORDER — BENZONATATE 100 MG PO CAPS: 200.0000 mg | ORAL_CAPSULE | Freq: Three times a day (TID) | ORAL | 0 refills | Status: DC

## 2017-05-29 MED ORDER — FLUTICASONE PROPIONATE 50 MCG/ACT NA SUSP: 1.0000 | Freq: Every day | NASAL | 2 refills | Status: DC

## 2017-05-29 NOTE — ED Provider Notes (Addendum)
[facility] EMERGENCY DEPARTMENT Provider Note   CSN: [id number] Arrival date & time: 05/29/17  2109     History   Chief Complaint Chief Complaint  Patient presents with  . Sinus Problem    HPI Kerry Sloan is a 47 y.o. female with past medical history of asthma, hypertension, who presents to ED for evaluation of rhinorrhea, sinus pressure and cough since yesterday.  Sick contacts at work with similar symptoms.  She has tried over-the-counter Mucinex with no improvement in her symptoms.  She denies any wheezing and has not had to use her inhaler since her symptoms began.  She is concerned that she needs some antibiotics for possible sinus infection.  Denies any sore throat, shortness of breath, nosebleeds, trouble breathing, fever.  She did receive her influenza vaccine this year.  HPI  Past Medical History:  Diagnosis Date  . Asthma   . GERD (gastroesophageal reflux disease)   . Hypertension   . Migraine     There are no active problems to display for this patient.   Past Surgical History:  Procedure Laterality Date  . ABLATION    . CHOLECYSTECTOMY    . FOOT SURGERY    . ROTATOR CUFF REPAIR    . TUBAL LIGATION      OB History    No data available       Home Medications    Prior to Admission medications   Medication Sig Start Date End Date Taking? Authorizing Provider  omeprazole (PRILOSEC) 20 MG capsule Take 20 mg by mouth daily.   Yes [provider]  SUMAtriptan (IMITREX) 100 MG tablet Take 1 tablet (100 mg total) by mouth every 2 (two) hours as needed for migraine. May repeat in 2 hours if headache persists or recurs. 11/17/15  Yes Linwood Dibbles, MD  Topiramate (TOPAMAX PO) Take by mouth.   Yes [provider]  albuterol (PROVENTIL HFA;VENTOLIN HFA) 108 (90 Base) MCG/ACT inhaler Inhale 1-2 puffs into the lungs every 6 (six) hours as needed for wheezing or shortness of breath. 05/29/17   Shamika Pedregon, PA-C  ALBUTEROL IN Inhale into the  lungs.    [provider]  benzonatate (TESSALON) 100 MG capsule Take 2 capsules (200 mg total) by mouth every 8 (eight) hours. 05/29/17   Manford Sprong, PA-C  cetirizine (ZYRTEC) 10 MG tablet Take 10 mg by mouth daily.    [provider]  fluticasone (FLONASE) 50 MCG/ACT nasal spray Place 1 spray into both nostrils daily. 05/29/17   Alante Weimann, PA-C  furosemide (LASIX) 20 MG tablet Take 2 tablets (40 mg total) by mouth daily. 10/07/16 10/12/16  Caccavale, Sophia, PA-C  ibuprofen (ADVIL,MOTRIN) 800 MG tablet Take 1 tablet (800 mg total) by mouth 3 (three) times daily. 05/27/15   Barrett Henle, PA-C  loratadine (CLARITIN) 10 MG tablet Take 1 tablet (10 mg total) by mouth daily. 05/29/17   Ray Gervasi, PA-C  Montelukast Sodium (SINGULAIR PO) Take by mouth.    [provider]    Family History No family history on file.  Social History Social History   Tobacco Use  . Smoking status: Never Smoker  . Smokeless tobacco: Never Used  Substance Use Topics  . Alcohol use: Yes    Comment: occ  . Drug use: No     Allergies   Patient has no known allergies.   Review of Systems Review of Systems  Constitutional: Negative for chills and fever.  HENT: Positive for rhinorrhea and sinus  pressure. Negative for congestion, facial swelling, hearing loss, nosebleeds, sore throat and trouble swallowing.   Respiratory: Positive for cough. Negative for shortness of breath.   Cardiovascular: Negative for chest pain.  Gastrointestinal: Negative for nausea and vomiting.  Neurological: Negative for headaches.     Physical Exam Updated Vital Signs BP (!) 160/110 (BP Location: Left Arm)   Pulse 90   Temp 98.9 F (37.2 C) (Oral)   Resp 18   Ht 5\' 5"  (1.651 m)   Wt (!) 145.2 kg (320 lb)   LMP 05/10/2017   SpO2 100%   BMI 53.25 kg/m   Physical Exam  Constitutional: She appears well-developed and well-nourished. No distress.  HENT:  Head: Normocephalic and  atraumatic.  Right Ear: Tympanic membrane normal.  Left Ear: Tympanic membrane normal.  Nose: Mucosal edema and rhinorrhea present.  Mouth/Throat: Uvula is midline. No trismus in the jaw. No uvula swelling. No tonsillar exudate.  Postnasal drainage seen.  Rhinorrhea noted on examination.  Eyes: Conjunctivae and EOM are normal. No scleral icterus.  Neck: Normal range of motion.  Cardiovascular: Normal rate, regular rhythm, normal heart sounds and intact distal pulses.  Pulmonary/Chest: Effort normal and breath sounds normal. No respiratory distress.  Neurological: She is alert.  Skin: No rash noted. She is not diaphoretic.  Psychiatric: She has a normal mood and affect.  Nursing note and vitals reviewed.    ED Treatments / Results  Labs (all labs ordered are listed, but only abnormal results are displayed) Labs Reviewed - No data to display  EKG  EKG Interpretation None       Radiology No results found.  Procedures Procedures (including critical care time)  Medications Ordered in ED Medications - No data to display   Initial Impression / Assessment and Plan / ED Course  I have reviewed the triage vital signs and the nursing notes.  Pertinent labs & imaging results that were available during my care of the patient were reviewed by me and considered in my medical decision making (see chart for details).     Patient presents to ED for evaluation of rhinorrhea, sinus pressure and cough since yesterday.  Sick contacts with similar symptoms.  Here she is afebrile with no use of antipyretics.  She does have rhinorrhea noted but denies any trouble breathing or trouble swallowing.  She is overall well-appearing.  She is concerned that she may need an antibiotic for the symptoms.  I explained to her that sinusitis warrants antibiotic coverage if symptoms have been going on for 10 days or longer.  Since her symptoms began yesterday, she should first try conservative treatment with  nasal spray, antitussives and antihistamine.  I did encourage her that if her symptoms do persist for longer than 10 days she should follow-up with her primary care provider for further management.  Her lungs are clear to auscultation bilaterally so I doubt pneumonia as a cause of her symptoms.  She would also like a refill of her albuterol inhaler.  Patient is agreeable to plan.  Patient appears stable for discharge at this time.  Strict return precautions given. Patient's blood pressure initially elevated in the emergency department today. Patient denies headache, change in vision, numbness, weakness, chest pain, dyspnea, dizziness, or lightheadedness therefore doubt hypertensive emergency. Discussed elevated blood pressure with the patient and the need for primary care follow up with potential need to initiate or change antihypertensive medications and or for further evaluation. Discussed return precaution signs/symptoms for hypertensive emergency as listed  above with the patient.   Portions of this note were generated with Scientist, clinical (histocompatibility and immunogenetics). Dictation errors may occur despite best attempts at proofreading.   Final Clinical Impressions(s) / ED Diagnoses   Final diagnoses:  Viral URI with cough    ED Discharge Orders        Ordered    fluticasone (FLONASE) 50 MCG/ACT nasal spray  Daily     05/29/17 2231    albuterol (PROVENTIL HFA;VENTOLIN HFA) 108 (90 Base) MCG/ACT inhaler  Every 6 hours PRN     05/29/17 2231    loratadine (CLARITIN) 10 MG tablet  Daily     05/29/17 2231    benzonatate (TESSALON) 100 MG capsule  Every 8 hours     05/29/17 2231         Dietrich Pates, PA-C 05/29/17 2237    Gwyneth Sprout, MD 05/31/17 2026

## 2017-05-29 NOTE — ED Triage Notes (Signed)
Pt c/o sinus drainage since yesterday, no fevers, states the OTC meds are not working and she might need a prescription or some antibiotics

## 2017-05-29 NOTE — Discharge Instructions (Signed)
Please read attached information regarding your condition. Take medication as directed. If her symptoms do not improve in 10 days, follow-up with your primary care provider for further management. Continue your home medications as previously prescribed. Return to ED for worsening symptoms, trouble breathing or trouble swallowing, chest pain or coughing up blood.

## 2017-08-11 ENCOUNTER — Other Ambulatory Visit: Payer: Self-pay

## 2017-08-11 ENCOUNTER — Encounter (HOSPITAL_BASED_OUTPATIENT_CLINIC_OR_DEPARTMENT_OTHER): Payer: Self-pay | Admitting: *Deleted

## 2017-08-11 ENCOUNTER — Emergency Department (HOSPITAL_BASED_OUTPATIENT_CLINIC_OR_DEPARTMENT_OTHER)
Admission: EM | Admit: 2017-08-11 | Discharge: 2017-08-12 | Disposition: A | Discharge status: Home / Self Care | Payer: BLUE CROSS/BLUE SHIELD | Attending: Emergency Medicine | Admitting: Emergency Medicine

## 2017-08-11 ENCOUNTER — Emergency Department (HOSPITAL_BASED_OUTPATIENT_CLINIC_OR_DEPARTMENT_OTHER): Payer: BLUE CROSS/BLUE SHIELD

## 2017-08-11 DIAGNOSIS — M25561 Pain in right knee: Secondary | ICD-10-CM | POA: Diagnosis not present

## 2017-08-11 DIAGNOSIS — Z79899 Other long term (current) drug therapy: Secondary | ICD-10-CM | POA: Insufficient documentation

## 2017-08-11 DIAGNOSIS — S299XXA Unspecified injury of thorax, initial encounter: Secondary | ICD-10-CM | POA: Diagnosis present

## 2017-08-11 DIAGNOSIS — Y939 Activity, unspecified: Secondary | ICD-10-CM | POA: Insufficient documentation

## 2017-08-11 DIAGNOSIS — Y92410 Unspecified street and highway as the place of occurrence of the external cause: Secondary | ICD-10-CM | POA: Diagnosis not present

## 2017-08-11 DIAGNOSIS — J45909 Unspecified asthma, uncomplicated: Secondary | ICD-10-CM | POA: Diagnosis not present

## 2017-08-11 DIAGNOSIS — M549 Dorsalgia, unspecified: Secondary | ICD-10-CM

## 2017-08-11 DIAGNOSIS — Y999 Unspecified external cause status: Secondary | ICD-10-CM | POA: Diagnosis not present

## 2017-08-11 DIAGNOSIS — I10 Essential (primary) hypertension: Secondary | ICD-10-CM | POA: Insufficient documentation

## 2017-08-11 DIAGNOSIS — S22080A Wedge compression fracture of T11-T12 vertebra, initial encounter for closed fracture: Secondary | ICD-10-CM | POA: Diagnosis not present

## 2017-08-11 LAB — PREGNANCY, URINE: PREG TEST UR: NEGATIVE

## 2017-08-11 MED ORDER — IBUPROFEN 400 MG PO TABS
600.0000 mg | ORAL_TABLET | Freq: Once | ORAL | Status: AC
  Administered 2017-08-11: 23:00:00 600 mg via ORAL
  Filled 2017-08-11: qty 1
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
## 2017-08-11 NOTE — ED Notes (Signed)
Patient transported to X-ray 
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
## 2017-08-12 MED ORDER — METHOCARBAMOL 500 MG PO TABS: 500.0000 mg | ORAL_TABLET | Freq: Two times a day (BID) | ORAL | 0 refills | Status: DC

## 2017-08-12 MED ORDER — IBUPROFEN 600 MG PO TABS: 600.0000 mg | ORAL_TABLET | Freq: Four times a day (QID) | ORAL | 0 refills | Status: DC | PRN
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
## 2018-02-21 ENCOUNTER — Other Ambulatory Visit: Payer: Self-pay

## 2018-02-21 ENCOUNTER — Encounter (HOSPITAL_BASED_OUTPATIENT_CLINIC_OR_DEPARTMENT_OTHER): Payer: Self-pay | Admitting: Emergency Medicine

## 2018-02-21 ENCOUNTER — Emergency Department (HOSPITAL_BASED_OUTPATIENT_CLINIC_OR_DEPARTMENT_OTHER)
Admission: EM | Admit: 2018-02-21 | Discharge: 2018-02-21 | Disposition: A | Discharge status: Home / Self Care | Payer: No Typology Code available for payment source | Attending: Emergency Medicine | Admitting: Emergency Medicine

## 2018-02-21 DIAGNOSIS — H5789 Other specified disorders of eye and adnexa: Secondary | ICD-10-CM | POA: Insufficient documentation

## 2018-02-21 DIAGNOSIS — I10 Essential (primary) hypertension: Secondary | ICD-10-CM | POA: Insufficient documentation

## 2018-02-21 DIAGNOSIS — J45909 Unspecified asthma, uncomplicated: Secondary | ICD-10-CM | POA: Insufficient documentation

## 2018-02-21 DIAGNOSIS — Z79899 Other long term (current) drug therapy: Secondary | ICD-10-CM | POA: Insufficient documentation

## 2018-02-21 MED ORDER — TOBRAMYCIN 0.3 % OP SOLN: 1.0000 [drp] | Freq: Four times a day (QID) | OPHTHALMIC | 0 refills | Status: AC

## 2018-02-21 MED ORDER — FLUORESCEIN SODIUM 1 MG OP STRP
1.0000 | ORAL_STRIP | Freq: Once | OPHTHALMIC | Status: AC
  Administered 2018-02-21: 1 via OPHTHALMIC
  Filled 2018-02-21: qty 1

## 2018-02-21 NOTE — ED Notes (Signed)
Pt unable to complete visual acuity, she does not have her glasses.

## 2018-02-21 NOTE — ED Triage Notes (Signed)
Pt reports waking up with itchy eyes and redness.

## 2018-02-21 NOTE — Discharge Instructions (Signed)
Use tobramycin drop in each eye 4 times daily for 5 days.  Continue taking your Zyrtec and Singulair.  Also recommend Systane drops.  Please follow-up with your eye doctor if your symptoms are not improving.  Avoid wearing her contacts until you are feeling better.  Please return the emergency department or see her eye doctor immediately if you develop any new or worsening symptoms.

## 2018-02-21 NOTE — ED Provider Notes (Signed)
[facility] EMERGENCY DEPARTMENT Provider Note   CSN: [id number] Arrival date & time: 02/21/18  0947     History   Chief Complaint Chief Complaint  Patient presents with  . Eye Problem    HPI Kerry Sloan is a 47 y.o. female with history of hypertension, migraine, GERD, asthma, allergies who presents with itchy, red eyes.  She woke up with her eyes matted together.  She reports she was with her mother yesterday who just had cataract surgery and they are getting infected.  Patient thinks might be in her head, however she woke up with the symptoms today.  She wears contacts sometimes, but has not worn them in a month.  She wears eyeglasses, however she did not bring them with her today.  Patient takes Zyrtec and Singulair for her allergies.   HPI  Past Medical History:  Diagnosis Date  . Asthma   . GERD (gastroesophageal reflux disease)   . Hypertension   . Migraine     There are no active problems to display for this patient.   Past Surgical History:  Procedure Laterality Date  . ABLATION    . CHOLECYSTECTOMY    . FOOT SURGERY    . ROTATOR CUFF REPAIR    . TUBAL LIGATION       OB History   None      Home Medications    Prior to Admission medications   Medication Sig Start Date End Date Taking? Authorizing Provider  albuterol (PROVENTIL HFA;VENTOLIN HFA) 108 (90 Base) MCG/ACT inhaler Inhale 1-2 puffs into the lungs every 6 (six) hours as needed for wheezing or shortness of breath. 05/29/17   Khatri, Hina, PA-C  ALBUTEROL IN Inhale into the lungs.    [provider]  benzonatate (TESSALON) 100 MG capsule Take 2 capsules (200 mg total) by mouth every 8 (eight) hours. 05/29/17   Khatri, Hina, PA-C  cetirizine (ZYRTEC) 10 MG tablet Take 10 mg by mouth daily.    [provider]  fluticasone (FLONASE) 50 MCG/ACT nasal spray Place 1 spray into both nostrils daily. 05/29/17   Khatri, Hina, PA-C  furosemide (LASIX) 20 MG tablet Take 2 tablets (40  mg total) by mouth daily. 10/07/16 10/12/16  Caccavale, Sophia, PA-C  ibuprofen (ADVIL,MOTRIN) 600 MG tablet Take 1 tablet (600 mg total) by mouth every 6 (six) hours as needed. 08/12/17   Dartha LodgeFord, Kelsey N, PA-C  loratadine (CLARITIN) 10 MG tablet Take 1 tablet (10 mg total) by mouth daily. 05/29/17   Khatri, Hina, PA-C  methocarbamol (ROBAXIN) 500 MG tablet Take 1 tablet (500 mg total) by mouth 2 (two) times daily. 08/12/17   Dartha LodgeFord, Kelsey N, PA-C  Montelukast Sodium (SINGULAIR PO) Take by mouth.    [provider]  omeprazole (PRILOSEC) 20 MG capsule Take 20 mg by mouth daily.    [provider]  SUMAtriptan (IMITREX) 100 MG tablet Take 1 tablet (100 mg total) by mouth every 2 (two) hours as needed for migraine. May repeat in 2 hours if headache persists or recurs. 11/17/15   Linwood DibblesKnapp, Jon, MD  tobramycin (TOBREX) 0.3 % ophthalmic solution Place 1 drop into both eyes every 6 (six) hours for 5 days. 02/21/18 02/26/18  Emi HolesLaw, Lindell Tussey M, PA-C  Topiramate (TOPAMAX PO) Take by mouth.    [provider]    Family History No family history on file.  Social History Social History   Tobacco Use  . Smoking status: Never Smoker  . Smokeless tobacco: Never Used  Substance Use Topics  . Alcohol use: Yes    Comment: occ  . Drug use: No     Allergies   Patient has no known allergies.   Review of Systems Review of Systems  Constitutional: Negative for fever.  Eyes: Positive for discharge, redness and itching. Negative for photophobia and visual disturbance.     Physical Exam Updated Vital Signs BP (!) 150/91 (BP Location: Left Arm)   Pulse 86   Temp 98.4 F (36.9 C) (Oral)   Resp 20   Ht 5\' 5"  (1.651 m)   Wt (!) 152 kg   SpO2 98%   BMI 55.75 kg/m   Physical Exam  Constitutional: She appears well-developed and well-nourished. No distress.  HENT:  Head: Normocephalic and atraumatic.  Mouth/Throat: Oropharynx is clear and moist. No oropharyngeal exudate.  Eyes:  Pupils are equal, round, and reactive to light. EOM are normal. Right eye exhibits no discharge. Left eye exhibits no discharge. No scleral icterus.  Patient refused visual acuity, because she forgot her glasses and cannot see far away without them No uptake bilaterally with fluorescein staining EOMs intact without pain, no consensual photophobia Mild, if any conjunctival injection bilaterally, no drainage  Neck: Normal range of motion. Neck supple. No thyromegaly present.  Cardiovascular: Normal rate, regular rhythm, normal heart sounds and intact distal pulses. Exam reveals no gallop and no friction rub.  No murmur heard. Pulmonary/Chest: Effort normal and breath sounds normal. No stridor. No respiratory distress. She has no wheezes. She has no rales.  Abdominal: Soft. Bowel sounds are normal. She exhibits no distension. There is no tenderness. There is no rebound and no guarding.  Musculoskeletal: She exhibits no edema.  Lymphadenopathy:    She has no cervical adenopathy.  Neurological: She is alert. Coordination normal.  Skin: Skin is warm and dry. No rash noted. She is not diaphoretic. No pallor.  Psychiatric: She has a normal mood and affect.  Nursing note and vitals reviewed.    ED Treatments / Results  Labs (all labs ordered are listed, but only abnormal results are displayed) Labs Reviewed - No data to display  EKG None  Radiology No results found.  Procedures Procedures (including critical care time)  Medications Ordered in ED Medications  fluorescein ophthalmic strip 1 strip (1 strip Both Eyes Given 02/21/18 1007)     Initial Impression / Assessment and Plan / ED Course  I have reviewed the triage vital signs and the nursing notes.  Pertinent labs & imaging results that were available during my care of the patient were reviewed by me and considered in my medical decision making (see chart for details).     Patient with probable allergic conjunctivitis, however  will cover with tobramycin considering patient reporting drainage and matting this morning.  I advised over-the-counter Systane eyedrops as well.  Follow-up to her eye doctor for recheck.  Return precautions discussed.  Patient understands and agrees with plan.  Patient vitals stable throughout ED course and discharged in satisfactory condition.  Final Clinical Impressions(s) / ED Diagnoses   Final diagnoses:  Eye irritation    ED Discharge Orders         Ordered    tobramycin (TOBREX) 0.3 % ophthalmic solution  Every 6 hours     02/21/18 [address] [place] 02/21/18 1110    Tegeler, Canary Brim, MD 02/21/18 1623

## 2018-03-08 ENCOUNTER — Encounter (HOSPITAL_BASED_OUTPATIENT_CLINIC_OR_DEPARTMENT_OTHER): Payer: Self-pay | Admitting: *Deleted

## 2018-03-08 ENCOUNTER — Emergency Department (HOSPITAL_BASED_OUTPATIENT_CLINIC_OR_DEPARTMENT_OTHER)
Admission: EM | Admit: 2018-03-08 | Discharge: 2018-03-08 | Disposition: A | Discharge status: Home / Self Care | Payer: Self-pay | Attending: Emergency Medicine | Admitting: Emergency Medicine

## 2018-03-08 ENCOUNTER — Other Ambulatory Visit: Payer: Self-pay

## 2018-03-08 DIAGNOSIS — Z79899 Other long term (current) drug therapy: Secondary | ICD-10-CM | POA: Insufficient documentation

## 2018-03-08 DIAGNOSIS — J45909 Unspecified asthma, uncomplicated: Secondary | ICD-10-CM | POA: Insufficient documentation

## 2018-03-08 DIAGNOSIS — J020 Streptococcal pharyngitis: Secondary | ICD-10-CM | POA: Insufficient documentation

## 2018-03-08 DIAGNOSIS — I10 Essential (primary) hypertension: Secondary | ICD-10-CM | POA: Insufficient documentation

## 2018-03-08 MED ORDER — MAGIC MOUTHWASH: 5.0000 mL | Freq: Three times a day (TID) | ORAL | 0 refills | Status: DC | PRN

## 2018-03-08 MED ORDER — IBUPROFEN 600 MG PO TABS: 600.0000 mg | ORAL_TABLET | Freq: Four times a day (QID) | ORAL | 0 refills | Status: AC | PRN

## 2018-03-08 MED ORDER — DEXAMETHASONE SODIUM PHOSPHATE 10 MG/ML IJ SOLN
10.0000 mg | Freq: Once | INTRAMUSCULAR | Status: AC
  Administered 2018-03-08: 10 mg via INTRAMUSCULAR
  Filled 2018-03-08: qty 1

## 2018-03-08 MED ORDER — PENICILLIN G BENZATHINE & PROC 1200000 UNIT/2ML IM SUSP
2.4000 10*6.[IU] | Freq: Once | INTRAMUSCULAR | Status: AC
  Administered 2018-03-08: 2.4 10*6.[IU] via INTRAMUSCULAR
  Filled 2018-03-08: qty 4

## 2018-03-08 MED ORDER — NAPROXEN 250 MG PO TABS
500.0000 mg | ORAL_TABLET | Freq: Once | ORAL | Status: AC
  Administered 2018-03-08: 500 mg via ORAL
  Filled 2018-03-08: qty 2

## 2018-03-08 MED FILL — MAGIC MW LID/MAAL/DP1:1:1: 2 | 2 days supply | Qty: 30 | Fill #0

## 2018-03-08 NOTE — ED Triage Notes (Signed)
Pt has had a sore throat for 3 days, can hardly swallow.

## 2018-03-08 NOTE — ED Notes (Signed)
NAD at this time. Pt is stable and going home.  

## 2018-03-08 NOTE — ED Provider Notes (Addendum)
[facility] EMERGENCY DEPARTMENT Provider Note   CSN: [id number] Arrival date & time: 03/08/18  1006     History   Chief Complaint Chief Complaint  Patient presents with  . Sore Throat    HPI Kerry Sloan is a 47 y.o. female.  HPI  47 year old female comes in with chief complaint of sore throat.  Patient states that her sore throat started about 2 days ago.  Over time her symptoms have worsened, and now she is having dysphagia and hoarseness in her voice. She works at a [employer] facility and is around patients who are sick.  She denies any associated nausea, vomiting, cough however, review of system is positive for fevers with T-max of 101.  Patient also denies any flulike illness-like chills, body aches and does not have any other URI-like symptoms.  Past Medical History:  Diagnosis Date  . Asthma   . GERD (gastroesophageal reflux disease)   . Hypertension   . Migraine     There are no active problems to display for this patient.   Past Surgical History:  Procedure Laterality Date  . ABLATION    . CHOLECYSTECTOMY    . FOOT SURGERY    . ROTATOR CUFF REPAIR    . TUBAL LIGATION       OB History   No obstetric history on file.      Home Medications    Prior to Admission medications   Medication Sig Start Date End Date Taking? Authorizing Provider  albuterol (PROVENTIL HFA;VENTOLIN HFA) 108 (90 Base) MCG/ACT inhaler Inhale 1-2 puffs into the lungs every 6 (six) hours as needed for wheezing or shortness of breath. 05/29/17   Khatri, Hina, PA-C  ALBUTEROL IN Inhale into the lungs.    [provider]  benzonatate (TESSALON) 100 MG capsule Take 2 capsules (200 mg total) by mouth every 8 (eight) hours. 05/29/17   Khatri, Hina, PA-C  cetirizine (ZYRTEC) 10 MG tablet Take 10 mg by mouth daily.    [provider]  fluticasone (FLONASE) 50 MCG/ACT nasal spray Place 1 spray into both nostrils daily. 05/29/17   Khatri, Hina, PA-C    furosemide (LASIX) 20 MG tablet Take 2 tablets (40 mg total) by mouth daily. 10/07/16 10/12/16  Caccavale, Sophia, PA-C  ibuprofen (ADVIL,MOTRIN) 600 MG tablet Take 1 tablet (600 mg total) by mouth every 6 (six) hours as needed. 03/08/18   Derwood KaplanNanavati, Oriel Ojo, MD  loratadine (CLARITIN) 10 MG tablet Take 1 tablet (10 mg total) by mouth daily. 05/29/17   Khatri, Hina, PA-C  magic mouthwash SOLN Take 5 mLs by mouth 3 (three) times daily as needed for mouth pain. 03/08/18   Derwood KaplanNanavati, Rook Maue, MD  methocarbamol (ROBAXIN) 500 MG tablet Take 1 tablet (500 mg total) by mouth 2 (two) times daily. 08/12/17   Dartha LodgeFord, Kelsey N, PA-C  Montelukast Sodium (SINGULAIR PO) Take by mouth.    [provider]  omeprazole (PRILOSEC) 20 MG capsule Take 20 mg by mouth daily.    [provider]  SUMAtriptan (IMITREX) 100 MG tablet Take 1 tablet (100 mg total) by mouth every 2 (two) hours as needed for migraine. May repeat in 2 hours if headache persists or recurs. 11/17/15   Linwood DibblesKnapp, Jon, MD  Topiramate (TOPAMAX PO) Take by mouth.    [provider]    Family History History reviewed. No pertinent family history.  Social History Social History   Tobacco Use  . Smoking status: Never Smoker  . Smokeless tobacco: Never  Used  Substance Use Topics  . Alcohol use: Yes    Comment: occ  . Drug use: No     Allergies   Patient has no known allergies.   Review of Systems Review of Systems  Constitutional: Positive for activity change, chills and fever.  HENT: Positive for sore throat.   Respiratory: Negative for cough.   Allergic/Immunologic: Negative for immunocompromised state.     Physical Exam Updated Vital Signs BP (!) 115/98 (BP Location: Left Arm)   Pulse (!) 104   Temp 98.7 F (37.1 C) (Oral)   Resp 20   Ht 5\' 5"  (1.651 m)   Wt (!) 152 kg   SpO2 98%   BMI 55.75 kg/m   Physical Exam Vitals signs and nursing note reviewed.  Constitutional:      Appearance: She is  well-developed.  HENT:     Head: Normocephalic and atraumatic.     Comments: Pt has no trismus, stridor or crepitus over the neck. Pt has + tonsillar enlargement without clear exudates. Pharynx is erythematous.  Neck:     Musculoskeletal: Normal range of motion and neck supple.  Cardiovascular:     Rate and Rhythm: Normal rate.  Pulmonary:     Effort: Pulmonary effort is normal.  Abdominal:     General: Bowel sounds are normal.  Lymphadenopathy:     Cervical: Cervical adenopathy present.  Skin:    General: Skin is warm and dry.  Neurological:     Mental Status: She is alert and oriented to person, place, and time.      ED Treatments / Results  Labs (all labs ordered are listed, but only abnormal results are displayed) Labs Reviewed - No data to display  EKG None  Radiology No results found.  Procedures Procedures (including critical care time)  Medications Ordered in ED Medications  dexamethasone (DECADRON) injection 10 mg (has no administration in time range)  naproxen (NAPROSYN) tablet 500 mg (has no administration in time range)  penicillin g procaine-penicillin g benzathine (BICILLIN-CR) injection 600000-600000 units (has no administration in time range)     Initial Impression / Assessment and Plan / ED Course  I have reviewed the triage vital signs and the nursing notes.  Pertinent labs & imaging results that were available during my care of the patient were reviewed by me and considered in my medical decision making (see chart for details).    Patient comes in with chief complaint of sore throat.  She has fevers with T-max of 101 at home.  Associated with this sore throat is dysphagia and dysphonia.  Patient is denying any cough.  She has 4 out of 4 Centor criteria and the pretest probability is high for strep infection.  We will give her IM penicillin and IM Decadron.  Naprosyn also ordered.  Final Clinical Impressions(s) / ED Diagnoses   Final diagnoses:   Strep pharyngitis    ED Discharge Orders         Ordered    ibuprofen (ADVIL,MOTRIN) 600 MG tablet  Every 6 hours PRN     03/08/18 1154    magic mouthwash SOLN  3 times daily PRN     03/08/18 1154           Derwood KaplanNanavati, Ysabela Keisler, MD 03/08/18 1152    Derwood KaplanNanavati, Dontrelle Mazon, MD 03/08/18 1155

## 2018-03-08 NOTE — Discharge Instructions (Signed)
We saw in the ER for sore throat. It appears that you have a strep infection. We have given you penicillin one-time injection for the strep infection. Take the medications prescribed for symptom control. Return to the ER if the swelling in your throat gets worse and he started having difficulty in breathing or start drooling.
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
## 2019-02-04 ENCOUNTER — Other Ambulatory Visit: Payer: Self-pay

## 2019-02-04 ENCOUNTER — Encounter (HOSPITAL_BASED_OUTPATIENT_CLINIC_OR_DEPARTMENT_OTHER): Payer: Self-pay | Admitting: *Deleted

## 2019-02-04 ENCOUNTER — Emergency Department (HOSPITAL_BASED_OUTPATIENT_CLINIC_OR_DEPARTMENT_OTHER)
Admission: EM | Admit: 2019-02-04 | Discharge: 2019-02-04 | Disposition: A | Discharge status: Home / Self Care | Payer: Self-pay | Attending: Emergency Medicine | Admitting: Emergency Medicine

## 2019-02-04 DIAGNOSIS — J45909 Unspecified asthma, uncomplicated: Secondary | ICD-10-CM | POA: Insufficient documentation

## 2019-02-04 DIAGNOSIS — I10 Essential (primary) hypertension: Secondary | ICD-10-CM | POA: Insufficient documentation

## 2019-02-04 DIAGNOSIS — T783XXA Angioneurotic edema, initial encounter: Secondary | ICD-10-CM | POA: Insufficient documentation

## 2019-02-04 MED ORDER — DEXAMETHASONE SODIUM PHOSPHATE 10 MG/ML IJ SOLN
10.0000 mg | Freq: Once | INTRAMUSCULAR | Status: AC
  Administered 2019-02-04: 10 mg via INTRAVENOUS
  Filled 2019-02-04: qty 1

## 2019-02-04 MED ORDER — DIPHENHYDRAMINE HCL 25 MG PO TABS: ORAL_TABLET | ORAL | 0 refills | Status: DC

## 2019-02-04 MED ORDER — DIPHENHYDRAMINE HCL 50 MG/ML IJ SOLN
25.0000 mg | Freq: Once | INTRAMUSCULAR | Status: AC
  Administered 2019-02-04: 02:00:00 25 mg via INTRAVENOUS
  Filled 2019-02-04: qty 1

## 2019-02-04 MED ORDER — DIPHENHYDRAMINE HCL 25 MG PO TABS: ORAL_TABLET | ORAL | 0 refills | Status: AC

## 2019-02-04 MED ORDER — FAMOTIDINE IN NACL 20-0.9 MG/50ML-% IV SOLN
20.0000 mg | Freq: Once | INTRAVENOUS | Status: AC
  Administered 2019-02-04: 02:00:00 20 mg via INTRAVENOUS
  Filled 2019-02-04: qty 50

## 2019-02-04 NOTE — ED Provider Notes (Signed)
[facility] Provider Note: Lowella Dell, MD, FACEP  CSN: [id number] MRN: [id number] ARRIVAL: 02/04/19 at 0111 ROOM: [facility]   CHIEF COMPLAINT  Oral Swelling   HISTORY OF PRESENT ILLNESS  02/04/19 1:36 AM Kerry Sloan is a 48 y.o. female with no history of angioedema and not on any ACE inhibitor or angiotensin converting enzyme blocker.  She awoke just prior to arrival with swelling of her upper lip.  Symptoms are moderate to severe but have improved without treatment.  She states it "feels funny" but denies pain.  There is no involvement of her tongue or throat and she is not short of breath.  She denies itching, hives, nausea, vomiting or diarrhea.   Past Medical History:  Diagnosis Date  . Asthma   . GERD (gastroesophageal reflux disease)   . Hypertension   . Migraine     Past Surgical History:  Procedure Laterality Date  . ABLATION    . CHOLECYSTECTOMY    . FOOT SURGERY    . ROTATOR CUFF REPAIR    . TUBAL LIGATION      No family history on file.  Social History   Tobacco Use  . Smoking status: Never Smoker  . Smokeless tobacco: Never Used  Substance Use Topics  . Alcohol use: Yes    Comment: occ  . Drug use: No    Prior to Admission medications   Medication Sig Start Date End Date Taking? Authorizing Provider  Elagolix Sodium (ORILISSA) 200 MG TABS Take 200 mg by mouth 2 (two) times daily. 01/17/19  Yes [provider]  ibuprofen (ADVIL,MOTRIN) 600 MG tablet Take 1 tablet (600 mg total) by mouth every 6 (six) hours as needed. 03/08/18  Yes Derwood Kaplan, MD  omeprazole (PRILOSEC) 20 MG capsule Take 20 mg by mouth daily.   Yes [provider]  albuterol (PROVENTIL HFA;VENTOLIN HFA) 108 (90 Base) MCG/ACT inhaler Inhale 1-2 puffs into the lungs every 6 (six) hours as needed for wheezing or shortness of breath. 05/29/17   Khatri, Hina, PA-C  diphenhydrAMINE (BENADRYL) 25 MG tablet Take 2 tablets every 6 hours as needed for lip  swelling. 02/04/19   Torrez Renfroe, MD  ALBUTEROL IN Inhale into the lungs.  02/04/19  [provider]  cetirizine (ZYRTEC) 10 MG tablet Take 10 mg by mouth daily.  02/04/19  [provider]  fluticasone (FLONASE) 50 MCG/ACT nasal spray Place 1 spray into both nostrils daily. 05/29/17 02/04/19  Khatri, Hina, PA-C  furosemide (LASIX) 20 MG tablet Take 2 tablets (40 mg total) by mouth daily. 10/07/16 02/04/19  Caccavale, Sophia, PA-C  loratadine (CLARITIN) 10 MG tablet Take 1 tablet (10 mg total) by mouth daily. 05/29/17 02/04/19  Dietrich Pates, PA-C  Montelukast Sodium (SINGULAIR PO) Take by mouth.  02/04/19  [provider]  SUMAtriptan (IMITREX) 100 MG tablet Take 1 tablet (100 mg total) by mouth every 2 (two) hours as needed for migraine. May repeat in 2 hours if headache persists or recurs. 11/17/15 02/04/19  Linwood Dibbles, MD  Topiramate (TOPAMAX PO) Take by mouth.  02/04/19  [provider]    Allergies Patient has no known allergies.   REVIEW OF SYSTEMS  Negative except as noted here or in the History of Present Illness.   PHYSICAL EXAMINATION  Initial Vital Signs Blood pressure (!) 153/106, pulse 83, temperature 98.2 F (36.8 C), temperature source Oral, resp. rate 20, SpO2 100 %.  Examination General: Well-developed, well-nourished female in no acute distress; appearance consistent  with age of record HENT: normocephalic; atraumatic; angioedema of upper lip; no edema of tongue or pharynx Eyes: pupils equal, round and reactive to light; extraocular muscles intact Neck: supple Heart: regular rate and rhythm Lungs: clear to auscultation bilaterally Abdomen: soft; nondistended; nontender; bowel sounds present Extremities: No deformity; full range of motion; pulses normal Neurologic: Awake, alert and oriented; motor function intact in all extremities and symmetric; no facial droop Skin: Warm and dry; no rash Psychiatric: Normal mood and affect    RESULTS  Summary of this visit's results, reviewed and interpreted by myself:   EKG Interpretation  Date/Time:    Ventricular Rate:    PR Interval:    QRS Duration:   QT Interval:    QTC Calculation:   R Axis:     Text Interpretation:        Laboratory Studies: No results found for this or any previous visit (from the past 24 hour(s)). Imaging Studies: No results found.  ED COURSE and MDM  Nursing notes, initial and subsequent vitals signs, including pulse oximetry, reviewed and interpreted by myself.  Vitals:   02/04/19 0122  BP: (!) 153/106  Pulse: 83  Resp: 20  Temp: 98.2 F (36.8 C)  TempSrc: Oral  SpO2: 100%   Medications  diphenhydrAMINE (BENADRYL) injection 25 mg (25 mg Intravenous Given 02/04/19 0204)  famotidine (PEPCID) IVPB 20 mg premix (0 mg Intravenous Stopped 02/04/19 0245)  dexamethasone (DECADRON) injection 10 mg (10 mg Intravenous Given 02/04/19 0204)   3:45 AM Edema improved but not completely resolved after IV meds.   4:52 AM Patient further improved and would like to go home.  We will have her continue to take Benadryl 50 mg every 6 hours as needed for lip swelling and to return immediately for any difficulty breathing or throat swelling.  The cause of her angioedema is unclear.  She is not on an ACE inhibitor or angiotensin receptor blocker and she has no history of familial angioedema.  This likely was an allergic reaction to an unknown agent.  PROCEDURES  Procedures   ED DIAGNOSES     ICD-10-CM   1. Angioedema of lips, initial encounter  T78.Mechele Dawley, MD 02/04/19 [phone]

## 2019-02-04 NOTE — ED Triage Notes (Signed)
Pt reports that she woke up around midnight with upper lip swelling. No difficulty swallowing, breathing, or any rashes/itching. No meds PTA. Pt denies any allergies.

## 2019-07-30 ENCOUNTER — Encounter (HOSPITAL_BASED_OUTPATIENT_CLINIC_OR_DEPARTMENT_OTHER): Payer: Self-pay | Admitting: Emergency Medicine

## 2019-07-30 ENCOUNTER — Other Ambulatory Visit: Payer: Self-pay

## 2019-07-30 ENCOUNTER — Emergency Department (HOSPITAL_BASED_OUTPATIENT_CLINIC_OR_DEPARTMENT_OTHER)
Admission: EM | Admit: 2019-07-30 | Discharge: 2019-07-30 | Disposition: A | Discharge status: Home / Self Care | Payer: BLUE CROSS/BLUE SHIELD | Attending: Emergency Medicine | Admitting: Emergency Medicine

## 2019-07-30 DIAGNOSIS — H1131 Conjunctival hemorrhage, right eye: Secondary | ICD-10-CM | POA: Diagnosis not present

## 2019-07-30 DIAGNOSIS — Z79899 Other long term (current) drug therapy: Secondary | ICD-10-CM | POA: Diagnosis not present

## 2019-07-30 DIAGNOSIS — I10 Essential (primary) hypertension: Secondary | ICD-10-CM | POA: Diagnosis not present

## 2019-07-30 DIAGNOSIS — H5789 Other specified disorders of eye and adnexa: Secondary | ICD-10-CM | POA: Diagnosis present

## 2019-07-30 MED ORDER — NAPHAZOLINE-PHENIRAMINE 0.025-0.3 % OP SOLN: 1.0000 [drp] | OPHTHALMIC | 0 refills | Status: AC | PRN

## 2019-07-30 MED ORDER — FLUORESCEIN SODIUM 1 MG OP STRP
1.0000 | ORAL_STRIP | Freq: Once | OPHTHALMIC | Status: AC
  Administered 2019-07-30: 1 via OPHTHALMIC
  Filled 2019-07-30: qty 1

## 2019-07-30 MED ORDER — TETRACAINE HCL 0.5 % OP SOLN
1.0000 [drp] | Freq: Once | OPHTHALMIC | Status: AC
  Administered 2019-07-30: 1 [drp] via OPHTHALMIC
  Filled 2019-07-30: qty 4

## 2019-07-30 NOTE — Discharge Instructions (Signed)
Use eyedrops as directed.  As we discussed, this is most likely a broken blood vessel in the eye.  This should heal by itself.  It may take several weeks to fully resolve.  I provided you outpatient follow-up with ophthalmology.    Return to the emergency department for any pain in the eye, vision changes, fever, swelling of the eye or any other worsening or concerning symptoms.

## 2019-07-30 NOTE — ED Triage Notes (Signed)
Redness noted to half of R conjunctiva, also reports itching. Reports no problems seeing.

## 2019-07-30 NOTE — ED Provider Notes (Signed)
[place] EMERGENCY DEPARTMENT Provider Note   CSN: [id number] Arrival date & time: 07/30/19  1750     History Chief Complaint  Patient presents with  . Eye Problem    Kerry Sloan is a 49 y.o. female with PMH/o Asthma, GERD, HTN, Migraine who presents for evaluation of right eye redness that began yesterday.  She reports that when she woke up, she looked in the mirror and noted that the medial portion of her right eye was red.  She does not have any associated pain.  She states she had not had any preceding trauma, injury, fall.  She has not been coughing recently.  She does report she has been sneezing secondary to her allergies and she has had some irritation/itching of her eye prior to that.  She states that she has not any vision changes and denies any photophobia.  She did not get hit in the eye denies any trauma.  She wears glasses but no contacts.  She has not noted any facial swelling.  She has not noted any fevers.  The history is provided by the patient.       Past Medical History:  Diagnosis Date  . Asthma   . GERD (gastroesophageal reflux disease)   . Hypertension   . Migraine     There are no problems to display for this patient.   Past Surgical History:  Procedure Laterality Date  . ABLATION    . CHOLECYSTECTOMY    . FOOT SURGERY    . ROTATOR CUFF REPAIR    . TUBAL LIGATION       OB History   No obstetric history on file.     No family history on file.  Social History   Tobacco Use  . Smoking status: Never Smoker  . Smokeless tobacco: Never Used  Substance Use Topics  . Alcohol use: Yes    Comment: occ  . Drug use: No    Home Medications Prior to Admission medications   Medication Sig Start Date End Date Taking? Authorizing Provider  albuterol (PROVENTIL HFA;VENTOLIN HFA) 108 (90 Base) MCG/ACT inhaler Inhale 1-2 puffs into the lungs every 6 (six) hours as needed for wheezing or shortness of breath. 05/29/17  Yes Khatri, Hina,  PA-C  ibuprofen (ADVIL,MOTRIN) 600 MG tablet Take 1 tablet (600 mg total) by mouth every 6 (six) hours as needed. 03/08/18  Yes Nanavati, Ankit, MD  magnesium oxide (MAG-OX) 400 MG tablet Take by mouth. 07/01/19  Yes [provider]  ADVAIR HFA 45-21 MCG/ACT inhaler INHALE 2 PUFFS INTO THE LUNGS 2 TIMES DAILY FOR 30 DAYS 04/14/19   [provider]  diphenhydrAMINE (BENADRYL) 25 MG tablet Take 2 tablets every 6 hours as needed for lip swelling. 02/04/19   Molpus, John, MD  Elagolix Sodium (ORILISSA) 200 MG TABS Take 200 mg by mouth 2 (two) times daily. 01/17/19   [provider]  EPINEPHrine 0.3 mg/0.3 mL IJ SOAJ injection SMARTSIG:0.3 Milligram(s) IM Once PRN 04/07/19   [provider]  hydrochlorothiazide (HYDRODIURIL) 25 MG tablet Take 25 mg by mouth daily. 04/07/19   [provider]  montelukast (SINGULAIR) 10 MG tablet Take 10 mg by mouth daily. 04/07/19   [provider]  naphazoline-pheniramine (NAPHCON-A) 0.025-0.3 % ophthalmic solution Place 1 drop into the right eye every 4 (four) hours as needed for eye irritation. 07/30/19   Volanda Napoleon, PA-C  omeprazole (PRILOSEC) 20 MG capsule Take 20 mg by mouth daily.    [provider]  pantoprazole (PROTONIX) 40 MG tablet Take 40 mg by mouth daily. 07/06/19   [provider]  Vitamin D, Ergocalciferol, (DRISDOL) 1.25 MG (50000 UNIT) CAPS capsule Take 50,000 Units by mouth once a week. 07/01/19   [provider]  ALBUTEROL IN Inhale into the lungs.  02/04/19  [provider]  cetirizine (ZYRTEC) 10 MG tablet Take 10 mg by mouth daily.  02/04/19  [provider]  fluticasone (FLONASE) 50 MCG/ACT nasal spray Place 1 spray into both nostrils daily. 05/29/17 02/04/19  Khatri, Hina, PA-C  furosemide (LASIX) 20 MG tablet Take 2 tablets (40 mg total) by mouth daily. 10/07/16 02/04/19  Caccavale, Sophia, PA-C  loratadine (CLARITIN) 10 MG tablet Take 1 tablet (10 mg  total) by mouth daily. 05/29/17 02/04/19  Khatri, Hina, PA-C  SUMAtriptan (IMITREX) 100 MG tablet Take 1 tablet (100 mg total) by mouth every 2 (two) hours as needed for migraine. May repeat in 2 hours if headache persists or recurs. 11/17/15 02/04/19  Linwood Dibbles, MD  Topiramate (TOPAMAX PO) Take by mouth.  02/04/19  [provider]    Allergies    Patient has no known allergies.  Review of Systems   Review of Systems  Constitutional: Negative for fever.  Eyes: Positive for redness and itching. Negative for photophobia, discharge and visual disturbance.  All other systems reviewed and are negative.   Physical Exam Updated Vital Signs BP (!) 141/96 (BP Location: Right Wrist)   Pulse 78   Temp 98.9 F (37.2 C) (Oral)   Resp 18   Ht 5\' 5"  (1.651 m)   Wt (!) 152 kg   SpO2 100%   BMI 55.75 kg/m   Physical Exam Vitals and nursing note reviewed.  Constitutional:      Appearance: She is well-developed.  HENT:     Head: Normocephalic and atraumatic.  Eyes:     General: Lids are normal. No scleral icterus.       Right eye: No discharge.        Left eye: No discharge.     Extraocular Movements: Extraocular movements intact.     Right eye: Normal extraocular motion and no nystagmus.     Left eye: Normal extraocular motion and no nystagmus.     Comments: Subconjunctival hemorrhage noted to medial aspect of the right eye.  No hyphema, hypopyon. PERRL. EOMs intact. No nystagmus. No neglect.  No erythema, edema, warmth surrounding the periorbital region.  No consensual pain noted.  Pulmonary:     Effort: Pulmonary effort is normal.  Skin:    General: Skin is warm and dry.  Neurological:     Mental Status: She is alert.     Comments: CN III-XII intact.   Psychiatric:        Speech: Speech normal.        Behavior: Behavior normal.     ED Results / Procedures / Treatments   Labs (all labs ordered are listed, but only abnormal results are displayed) Labs Reviewed - No data  to display  EKG None  Radiology No results found.  Procedures Procedures (including critical care time)  Medications Ordered in ED Medications  fluorescein ophthalmic strip 1 strip (1 strip Both Eyes Given 07/30/19 2048)  tetracaine (PONTOCAINE) 0.5 % ophthalmic solution 1 drop (1 drop Both Eyes Given 07/30/19 2048)    ED Course  I have reviewed the triage vital signs and the nursing notes.  Pertinent labs & imaging results that were available during  my care of the patient were reviewed by me and considered in my medical decision making (see chart for details).    MDM Rules/Calculators/A&P                      49 year old female who presents for evaluation of right eye redness that began yesterday.  She woke up and looked in the mirror and noted that she had an eye that was red.  She did not have any associated pain.  She reports he has had some itching and irritation to the eye.  She has not had any coughing.  She states she has been sneezing.  No vision changes, fevers.  On initially arrival, she is afebrile, nontoxic-appearing.  Vital signs are stable.  On exam, she has subconjunctival hemorrhage in the medial aspect of the right eye.  No hyphema, hypopyon.  EOMs are intact with any difficulty.  No warmth, erythema noted periorbital region.  Exam not concerning for periorbital or orbital cellulitis.  Suspect this is most likely subconjunctival hemorrhage.  Question if this is from allergy/sneezing.  Exam is not concerning for iritis.  Woods lamp evaluation shows no evidence of corneal abrasion, fluorescein uptake.  No evidence of dendritic lesion.  Intraocular pressure as documented below:  Left IOP: 12, 13 Right IOP: 11, 12    Visual Acuity  Right Eye Distance: 20/70 Left Eye Distance: 20/70 Bilateral Distance: 20/70  Exam consistent with subconjunctival hemorrhage.  No evidence of infectious etiology.  No evidence of corneal abrasion.  She does report that she has allergies  and sometimes gets eye irritation.  We will plan to put her on eyedrops to help with symptomatic relief.  Patient instructed follow-up with ophthalmology for further evaluation if symptoms persist.  Encouraged at home supportive care measures. At this time, patient exhibits no emergent life-threatening condition that require further evaluation in ED or admission. Patient had ample opportunity for questions and discussion. All patient's questions were answered with full understanding. Strict return precautions discussed. Patient expresses understanding and agreement to plan.   Portions of this note were generated with Scientist, clinical (histocompatibility and immunogenetics). Dictation errors may occur despite best attempts at proofreading.   Final Clinical Impression(s) / ED Diagnoses Final diagnoses:  Subconjunctival hemorrhage of right eye    Rx / DC Orders ED Discharge Orders         Ordered    naphazoline-pheniramine (NAPHCON-A) 0.025-0.3 % ophthalmic solution  Every 4 hours PRN     07/30/19 2124           Rosana Hoes 07/30/19 2303    Tilden Fossa, MD 07/31/19 [phone]

## 2019-09-18 ENCOUNTER — Encounter (HOSPITAL_BASED_OUTPATIENT_CLINIC_OR_DEPARTMENT_OTHER): Payer: Self-pay | Admitting: Emergency Medicine

## 2019-09-18 ENCOUNTER — Emergency Department (HOSPITAL_BASED_OUTPATIENT_CLINIC_OR_DEPARTMENT_OTHER)
Admission: EM | Admit: 2019-09-18 | Discharge: 2019-09-18 | Disposition: A | Discharge status: Home / Self Care | Payer: BLUE CROSS/BLUE SHIELD | Attending: Emergency Medicine | Admitting: Emergency Medicine

## 2019-09-18 ENCOUNTER — Other Ambulatory Visit: Payer: Self-pay

## 2019-09-18 DIAGNOSIS — Z79899 Other long term (current) drug therapy: Secondary | ICD-10-CM | POA: Diagnosis not present

## 2019-09-18 DIAGNOSIS — I10 Essential (primary) hypertension: Secondary | ICD-10-CM | POA: Insufficient documentation

## 2019-09-18 DIAGNOSIS — H1032 Unspecified acute conjunctivitis, left eye: Secondary | ICD-10-CM

## 2019-09-18 DIAGNOSIS — H5789 Other specified disorders of eye and adnexa: Secondary | ICD-10-CM | POA: Insufficient documentation

## 2019-09-18 DIAGNOSIS — J45909 Unspecified asthma, uncomplicated: Secondary | ICD-10-CM | POA: Insufficient documentation

## 2019-09-18 MED ORDER — LEVOFLOXACIN 0.5 % OP SOLN
OPHTHALMIC | 0 refills | Status: DC
Start: 2019-09-18 — End: 2019-09-18

## 2019-09-18 MED ORDER — MOXIFLOXACIN HCL 0.5 % OP SOLN: OPHTHALMIC | 0 refills | Status: AC

## 2019-09-18 MED ORDER — TETRACAINE HCL 0.5 % OP SOLN
1.0000 [drp] | Freq: Once | OPHTHALMIC | Status: AC
  Administered 2019-09-18: 1 [drp] via OPHTHALMIC
  Filled 2019-09-18: qty 4

## 2019-09-18 NOTE — ED Triage Notes (Signed)
Left eye irritation with swelling and redness that started today.

## 2019-09-18 NOTE — ED Provider Notes (Signed)
[facility] EMERGENCY DEPARTMENT Provider Note  CSN: [id number] Arrival date & time: 09/18/19 0108  Chief Complaint(s) Eye Problem  HPI Kerry Sloan is a 49 y.o. female   CC: eye pain  Onset/Duration: Gradual for 1 day Timing: Constant worsening Location: Left eye Quality: Redness Severity: Moderate Modifying Factors:  Improved by: Nothing  Worsened by: Nothing Associated Signs/Symptoms:  Pertinent (+): Purulent discharge and mild swelling with conjunctivitis  Pertinent (-): Fevers, chills, nausea, vomiting, headache, post ocular pain.  She denies any trauma.   Context: Patient wears contact lenses infrequently.  Last use about a week ago.   HPI  Past Medical History Past Medical History:  Diagnosis Date  . Asthma   . GERD (gastroesophageal reflux disease)   . Hypertension   . Migraine    There are no problems to display for this patient.  Home Medication(s) Prior to Admission medications   Medication Sig Start Date End Date Taking? Authorizing Provider  ADVAIR HFA 45-21 MCG/ACT inhaler INHALE 2 PUFFS INTO THE LUNGS 2 TIMES DAILY FOR 30 DAYS 04/14/19   [provider]  albuterol (PROVENTIL HFA;VENTOLIN HFA) 108 (90 Base) MCG/ACT inhaler Inhale 1-2 puffs into the lungs every 6 (six) hours as needed for wheezing or shortness of breath. 05/29/17   Khatri, Hina, PA-C  diphenhydrAMINE (BENADRYL) 25 MG tablet Take 2 tablets every 6 hours as needed for lip swelling. 02/04/19   Molpus, John, MD  Elagolix Sodium (ORILISSA) 200 MG TABS Take 200 mg by mouth 2 (two) times daily. 01/17/19   [provider]  EPINEPHrine 0.3 mg/0.3 mL IJ SOAJ injection SMARTSIG:0.3 Milligram(s) IM Once PRN 04/07/19   [provider]  hydrochlorothiazide (HYDRODIURIL) 25 MG tablet Take 25 mg by mouth daily. 04/07/19   [provider]  ibuprofen (ADVIL,MOTRIN) 600 MG tablet Take 1 tablet (600 mg total) by mouth every 6 (six) hours as needed. 03/08/18   Derwood Kaplan, MD  levofloxacin Charlean Sanfilippo) 0.5 % ophthalmic solution 1-2 drops every 2 hours for 2 days (while awake) THEN every 6 hours for 5 day 09/18/19   Mesha Schamberger, Amadeo Garnet, MD  magnesium oxide (MAG-OX) 400 MG tablet Take by mouth. 07/01/19   [provider]  montelukast (SINGULAIR) 10 MG tablet Take 10 mg by mouth daily. 04/07/19   [provider]  naphazoline-pheniramine (NAPHCON-A) 0.025-0.3 % ophthalmic solution Place 1 drop into the right eye every 4 (four) hours as needed for eye irritation. 07/30/19   Maxwell Caul, PA-C  omeprazole (PRILOSEC) 20 MG capsule Take 20 mg by mouth daily.    [provider]  pantoprazole (PROTONIX) 40 MG tablet Take 40 mg by mouth daily. 07/06/19   [provider]  Vitamin D, Ergocalciferol, (DRISDOL) 1.25 MG (50000 UNIT) CAPS capsule Take 50,000 Units by mouth once a week. 07/01/19   [provider]  ALBUTEROL IN Inhale into the lungs.  02/04/19  [provider]  cetirizine (ZYRTEC) 10 MG tablet Take 10 mg by mouth daily.  02/04/19  [provider]  fluticasone (FLONASE) 50 MCG/ACT nasal spray Place 1 spray into both nostrils daily. 05/29/17 02/04/19  Khatri, Hina, PA-C  furosemide (LASIX) 20 MG tablet Take 2 tablets (40 mg total) by mouth daily. 10/07/16 02/04/19  Caccavale, Sophia, PA-C  loratadine (CLARITIN) 10 MG tablet Take 1 tablet (10 mg total) by mouth daily. 05/29/17 02/04/19  Khatri, Hina, PA-C  SUMAtriptan (IMITREX) 100 MG tablet Take 1 tablet (100 mg total) by mouth every 2 (two) hours as  needed for migraine. May repeat in 2 hours if headache persists or recurs. 11/17/15 02/04/19  Linwood Dibbles, MD  Topiramate (TOPAMAX PO) Take by mouth.  02/04/19  [provider]                                                                                                                                    Past Surgical History Past Surgical History:  Procedure Laterality Date  . ABLATION    .  CHOLECYSTECTOMY    . FOOT SURGERY    . ROTATOR CUFF REPAIR    . TUBAL LIGATION     Family History No family history on file.  Social History Social History   Tobacco Use  . Smoking status: Never Smoker  . Smokeless tobacco: Never Used  Substance Use Topics  . Alcohol use: Yes    Comment: occ  . Drug use: No   Allergies Patient has no known allergies.  Review of Systems Review of Systems All other systems are reviewed and are negative for acute change except as noted in the HPI  Physical Exam Vital Signs  I have reviewed the triage vital signs BP 100/65 (BP Location: Right Arm)   Pulse 93   Temp 98.5 F (36.9 C) (Oral)   Resp 20   Ht 5\' 5"  (1.651 m)   Wt (!) 149.7 kg   SpO2 100%   BMI 54.91 kg/m   Physical Exam Vitals reviewed.  Constitutional:      General: She is not in acute distress.    Appearance: She is well-developed. She is not diaphoretic.  HENT:     Head: Normocephalic and atraumatic.     Right Ear: External ear normal.     Left Ear: External ear normal.     Nose: Nose normal.  Eyes:     General: No scleral icterus.    Conjunctiva/sclera:     Right eye: Right conjunctiva is not injected. No exudate or hemorrhage.    Left eye: Left conjunctiva is injected. Exudate present. No hemorrhage.    Pupils: Pupils are equal, round, and reactive to light.     Left eye: No corneal abrasion or fluorescein uptake.     Slit lamp exam:    Left eye: No corneal flare, corneal ulcer, foreign body or hypopyon.  Neck:     Trachea: Phonation normal.  Cardiovascular:     Rate and Rhythm: Normal rate and regular rhythm.  Pulmonary:     Effort: Pulmonary effort is normal. No respiratory distress.     Breath sounds: No stridor.  Abdominal:     General: There is no distension.  Musculoskeletal:        General: Normal range of motion.     Cervical back: Normal range of motion.  Neurological:     Mental Status: She is alert and oriented to person, place, and time.    Psychiatric:  Behavior: Behavior normal.     ED Results and Treatments Labs (all labs ordered are listed, but only abnormal results are displayed) Labs Reviewed - No data to display                                                                                                                       EKG  EKG Interpretation  Date/Time:    Ventricular Rate:    PR Interval:    QRS Duration:   QT Interval:    QTC Calculation:   R Axis:     Text Interpretation:        Radiology No results found.  Pertinent labs & imaging results that were available during my care of the patient were reviewed by me and considered in my medical decision making (see chart for details).  Medications Ordered in ED Medications  tetracaine (PONTOCAINE) 0.5 % ophthalmic solution 1 drop (1 drop Right Eye Given 09/18/19 0142)                                                                                                                                    Procedures Procedures  (including critical care time)  Medical Decision Making / ED Course I have reviewed the nursing notes for this encounter and the patient's prior records (if available in EHR or on provided paperwork).   Kerry Sloan was evaluated in Emergency Department on 09/18/2019 for the symptoms described in the history of present illness. She was evaluated in the context of the global COVID-19 pandemic, which necessitated consideration that the patient might be at risk for infection with the SARS-CoV-2 virus that causes COVID-19. Institutional protocols and algorithms that pertain to the evaluation of patients at risk for COVID-19 are in a state of rapid change based on information released by regulatory bodies including the CDC and federal and state organizations. These policies and algorithms were followed during the patient's care in the ED.  Consistent with conjunctivitis, likely bacterial.  No evidence to suggest preseptal or orbital  cellulitis requiring any additional work-up.  No corneal abrasions or ulcers noted on exam.  Given patient's use of contact lens, will prescribe levofloxacin drops.      Final Clinical Impression(s) / ED Diagnoses Final diagnoses:  None   The patient appears reasonably screened and/or stabilized for discharge and I doubt any other medical condition or other [facility] requiring further screening, evaluation, or  treatment in the ED at this time prior to discharge. Safe for discharge with strict return precautions.  Disposition: Discharge  Condition: Good  I have discussed the results, Dx and Tx plan with the patient/family who expressed understanding and agree(s) with the plan. Discharge instructions discussed at length. The patient/family was given strict return precautions who verbalized understanding of the instructions. No further questions at time of discharge.    ED Discharge Orders         Ordered    levofloxacin Charlean Sanfilippo(QUIXIN) 0.5 % ophthalmic solution     Discontinue  Reprint     09/18/19 0218            Follow Up: [address] [facility] [address] [place] [address] [phone]  Schedule an appointment as soon as possible for a visit  If symptoms do not improve or  worsen in 3-5 days  Augustin SchoolingSun, Lisa L, MD [address] [place] [address] [phone]  Schedule an appointment as soon as possible for a visit  in 3-5 days, If symptoms do not improve or  worsen      This chart was dictated using voice recognition software.  Despite best efforts to proofread,  errors can occur which can change the documentation meaning.   Nira Connardama, Kaisyn Millea Eduardo, MD 09/18/19 Earle [id number]

## 2021-05-05 ENCOUNTER — Encounter (HOSPITAL_BASED_OUTPATIENT_CLINIC_OR_DEPARTMENT_OTHER): Payer: Self-pay | Admitting: Urology

## 2021-05-05 ENCOUNTER — Other Ambulatory Visit: Payer: Self-pay

## 2021-05-05 ENCOUNTER — Emergency Department (HOSPITAL_BASED_OUTPATIENT_CLINIC_OR_DEPARTMENT_OTHER)
Admission: EM | Admit: 2021-05-05 | Discharge: 2021-05-05 | Disposition: A | Discharge status: Home / Self Care | Payer: BLUE CROSS/BLUE SHIELD | Attending: Emergency Medicine | Admitting: Emergency Medicine

## 2021-05-05 DIAGNOSIS — T783XXA Angioneurotic edema, initial encounter: Secondary | ICD-10-CM | POA: Diagnosis not present

## 2021-05-05 DIAGNOSIS — R220 Localized swelling, mass and lump, head: Secondary | ICD-10-CM | POA: Diagnosis present

## 2021-05-05 MED ORDER — PREDNISONE 50 MG PO TABS
60.0000 mg | ORAL_TABLET | Freq: Once | ORAL | Status: AC
Start: 2021-05-05 — End: 2021-05-05
  Administered 2021-05-05: 60 mg via ORAL
  Filled 2021-05-05: qty 1

## 2021-05-05 MED ORDER — PREDNISONE 20 MG PO TABS: ORAL_TABLET | ORAL | 0 refills | Status: AC

## 2021-05-05 NOTE — ED Provider Notes (Signed)
[facility] EMERGENCY DEPARTMENT Provider Note   CSN: [id number] Arrival date & time: 05/05/21  1655     History  Chief Complaint  Patient presents with   Oral Swelling    Kerry Sloan is a 51 y.o. female here presenting with upper lip swelling.  Patient states that she woke up this morning and noticed her upper lip is swollen.  She has no trouble swallowing.  She has no rash.  Patient took some Benadryl and states that the lip swelling has not gotten worse.  Patient was thought to have angioedema from lisinopril previously.  Patient is now off of lisinopril.  She is on hydrochlorothiazide only.  She states that she does not have any new medicines.  She states that she wants to follow-up with the allergist  The history is provided by the patient.      Home Medications Prior to Admission medications   Medication Sig Start Date End Date Taking? Authorizing Provider  ADVAIR HFA 45-21 MCG/ACT inhaler INHALE 2 PUFFS INTO THE LUNGS 2 TIMES DAILY FOR 30 DAYS 04/14/19   [provider]  albuterol (PROVENTIL HFA;VENTOLIN HFA) 108 (90 Base) MCG/ACT inhaler Inhale 1-2 puffs into the lungs every 6 (six) hours as needed for wheezing or shortness of breath. 05/29/17   Khatri, Hina, PA-C  diphenhydrAMINE (BENADRYL) 25 MG tablet Take 2 tablets every 6 hours as needed for lip swelling. 02/04/19   Molpus, John, MD  Elagolix Sodium (ORILISSA) 200 MG TABS Take 200 mg by mouth 2 (two) times daily. 01/17/19   [provider]  EPINEPHrine 0.3 mg/0.3 mL IJ SOAJ injection SMARTSIG:0.3 Milligram(s) IM Once PRN 04/07/19   [provider]  hydrochlorothiazide (HYDRODIURIL) 25 MG tablet Take 25 mg by mouth daily. 04/07/19   [provider]  ibuprofen (ADVIL,MOTRIN) 600 MG tablet Take 1 tablet (600 mg total) by mouth every 6 (six) hours as needed. 03/08/18   Derwood Kaplan, MD  magnesium oxide (MAG-OX) 400 MG tablet Take by mouth. 07/01/19   [provider]   montelukast (SINGULAIR) 10 MG tablet Take 10 mg by mouth daily. 04/07/19   [provider]  moxifloxacin (VIGAMOX) 0.5 % ophthalmic solution 1-2 drops every 2 hours for 2 days THEN every 6 hours for 5 days 09/18/19   Cardama, Amadeo Garnet, MD  naphazoline-pheniramine (NAPHCON-A) 0.025-0.3 % ophthalmic solution Place 1 drop into the right eye every 4 (four) hours as needed for eye irritation. 07/30/19   Maxwell Caul, PA-C  omeprazole (PRILOSEC) 20 MG capsule Take 20 mg by mouth daily.    [provider]  pantoprazole (PROTONIX) 40 MG tablet Take 40 mg by mouth daily. 07/06/19   [provider]  Vitamin D, Ergocalciferol, (DRISDOL) 1.25 MG (50000 UNIT) CAPS capsule Take 50,000 Units by mouth once a week. 07/01/19   [provider]  ALBUTEROL IN Inhale into the lungs.  02/04/19  [provider]  cetirizine (ZYRTEC) 10 MG tablet Take 10 mg by mouth daily.  02/04/19  [provider]  fluticasone (FLONASE) 50 MCG/ACT nasal spray Place 1 spray into both nostrils daily. 05/29/17 02/04/19  Khatri, Hina, PA-C  furosemide (LASIX) 20 MG tablet Take 2 tablets (40 mg total) by mouth daily. 10/07/16 02/04/19  Caccavale, Sophia, PA-C  loratadine (CLARITIN) 10 MG tablet Take 1 tablet (10 mg total) by mouth daily. 05/29/17 02/04/19  Khatri, Hina, PA-C  SUMAtriptan (IMITREX) 100 MG tablet Take 1 tablet (100 mg total) by mouth every 2 (two) hours as  needed for migraine. May repeat in 2 hours if headache persists or recurs. 11/17/15 02/04/19  Linwood Dibbles, MD  Topiramate (TOPAMAX PO) Take by mouth.  02/04/19  [provider]      Allergies    Patient has no known allergies.    Review of Systems   Review of Systems  HENT:         Upper lip swelling   All other systems reviewed and are negative.  Physical Exam Updated Vital Signs BP 133/82    Pulse 86    Temp 98 F (36.7 C) (Oral)    Resp 20    Ht 5\' 5"  (1.651 m)    Wt (!) 149.7 kg    SpO2 95%    BMI  54.92 kg/m  Physical Exam Vitals and nursing note reviewed.  Constitutional:      Appearance: Normal appearance.  HENT:     Head: Normocephalic.     Nose: Nose normal.     Mouth/Throat:     Comments: Patient has upper lip swelling.  There is no obvious dental abscess.  Patient's posterior pharynx is clear and not swollen.  There is no cervical lymphadenopathy Eyes:     Extraocular Movements: Extraocular movements intact.     Pupils: Pupils are equal, round, and reactive to light.  Cardiovascular:     Rate and Rhythm: Normal rate and regular rhythm.     Pulses: Normal pulses.     Heart sounds: Normal heart sounds.  Pulmonary:     Effort: Pulmonary effort is normal.     Breath sounds: Normal breath sounds.  Abdominal:     General: Abdomen is flat. Bowel sounds are normal.     Palpations: Abdomen is soft.  Musculoskeletal:        General: Normal range of motion.     Cervical back: Normal range of motion and neck supple.  Skin:    General: Skin is warm.     Capillary Refill: Capillary refill takes less than 2 seconds.  Neurological:     General: No focal deficit present.     Mental Status: She is alert and oriented to person, place, and time.  Psychiatric:        Mood and Affect: Mood normal.    ED Results / Procedures / Treatments   Labs (all labs ordered are listed, but only abnormal results are displayed) Labs Reviewed - No data to display  EKG None  Radiology No results found.  Procedures Procedures    Medications Ordered in ED Medications  predniSONE (DELTASONE) tablet 60 mg (60 mg Oral Given 05/05/21 2007)    ED Course/ Medical Decision Making/ A&P                           Medical Decision Making Kerry Sloan is a 51 y.o. female here presenting with upper lip swelling.  Patient likely has angioedema.  Patient is not on any ACE inhibitors anymore.  I think this is likely idiopathic.  Patient has no posterior pharynx swelling or any rash or tongue  swelling.  At this point, patient can be discharged home with a course of prednisone.  She states that she wants to follow-up with an allergist.  Patient has EpiPen at home.   Problems Addressed: Angioedema, initial encounter: acute illness or injury  Risk Prescription drug management.   Final Clinical Impression(s) / ED Diagnoses Final diagnoses:  None    Rx / DC  Orders ED Discharge Orders     None         Charlynne Pander, MD 05/05/21 2036

## 2021-05-05 NOTE — Discharge Instructions (Addendum)
Take prednisone as prescribed   See an allergist for follow up   Return to ER if you have worse lip swelling, trouble breathing.

## 2021-05-05 NOTE — ED Triage Notes (Signed)
Upper lip swelling that started this am  Denies swelling to mouth or throat  Took benadryl at 1330

## 2021-08-25 ENCOUNTER — Other Ambulatory Visit: Payer: Self-pay

## 2021-08-25 ENCOUNTER — Emergency Department (HOSPITAL_BASED_OUTPATIENT_CLINIC_OR_DEPARTMENT_OTHER)
Admission: EM | Admit: 2021-08-25 | Discharge: 2021-08-25 | Disposition: A | Discharge status: Home / Self Care | Payer: BLUE CROSS/BLUE SHIELD | Attending: Emergency Medicine | Admitting: Emergency Medicine

## 2021-08-25 ENCOUNTER — Encounter (HOSPITAL_BASED_OUTPATIENT_CLINIC_OR_DEPARTMENT_OTHER): Payer: Self-pay | Admitting: Emergency Medicine

## 2021-08-25 DIAGNOSIS — H66004 Acute suppurative otitis media without spontaneous rupture of ear drum, recurrent, right ear: Secondary | ICD-10-CM | POA: Insufficient documentation

## 2021-08-25 DIAGNOSIS — H9201 Otalgia, right ear: Secondary | ICD-10-CM | POA: Diagnosis present

## 2021-08-25 MED ORDER — CEFDINIR 300 MG PO CAPS: 300.0000 mg | ORAL_CAPSULE | Freq: Two times a day (BID) | ORAL | 0 refills | Status: AC

## 2021-08-25 NOTE — ED Provider Notes (Signed)
[facility] EMERGENCY DEPARTMENT Provider Note   CSN: [id number] Arrival date & time: 08/25/21  1711     History  Chief Complaint  Patient presents with   Otalgia    Kerry Sloan is a 51 y.o. female that presents to the emergency department for ongoing right ear pain for 1 week.  Patient has been seen in urgent care twice and is on day 7 of 10 of her Augmentin prescription.  She initially also had congested symptoms as well that seem to have resolved although she continues to have right ear discomfort.  She saw her PCP 2 days ago where she was also diagnosed with otitis externa and given Ciprodex drops.  She states the pain feels "deep within the ear".  Patient denies fevers and chills, congestion, cough, sore throat.   Otalgia      Home Medications Prior to Admission medications   Medication Sig Start Date End Date Taking? Authorizing Provider  cefdinir (OMNICEF) 300 MG capsule Take 1 capsule (300 mg total) by mouth 2 (two) times daily for 10 days. 08/25/21 09/04/21 Yes Shronda Boeh R, PA-C  ADVAIR HFA [phone] MCG/ACT inhaler INHALE 2 PUFFS INTO THE LUNGS 2 TIMES DAILY FOR 30 DAYS 04/14/19   [provider]  albuterol (PROVENTIL HFA;VENTOLIN HFA) 108 (90 Base) MCG/ACT inhaler Inhale 1-2 puffs into the lungs every 6 (six) hours as needed for wheezing or shortness of breath. 05/29/17   Khatri, Hina, PA-C  diphenhydrAMINE (BENADRYL) 25 MG tablet Take 2 tablets every 6 hours as needed for lip swelling. 02/04/19   Molpus, John, MD  Elagolix Sodium (ORILISSA) 200 MG TABS Take 200 mg by mouth 2 (two) times daily. 01/17/19   [provider]  EPINEPHrine 0.3 mg/0.3 mL IJ SOAJ injection SMARTSIG:0.3 Milligram(s) IM Once PRN 04/07/19   [provider]  hydrochlorothiazide (HYDRODIURIL) 25 MG tablet Take 25 mg by mouth daily. 04/07/19   [provider]  ibuprofen (ADVIL,MOTRIN) 600 MG tablet Take 1 tablet (600 mg total) by mouth every 6 (six) hours as  needed. 03/08/18   Varney Biles, MD  magnesium oxide (MAG-OX) 400 MG tablet Take by mouth. 07/01/19   [provider]  montelukast (SINGULAIR) 10 MG tablet Take 10 mg by mouth daily. 04/07/19   [provider]  moxifloxacin (VIGAMOX) 0.5 % ophthalmic solution 1-2 drops every 2 hours for 2 days THEN every 6 hours for 5 days 09/18/19   Cardama, Grayce Sessions, MD  naphazoline-pheniramine (NAPHCON-A) 0.025-0.3 % ophthalmic solution Place 1 drop into the right eye every 4 (four) hours as needed for eye irritation. 07/30/19   Volanda Napoleon, PA-C  omeprazole (PRILOSEC) 20 MG capsule Take 20 mg by mouth daily.    [provider]  pantoprazole (PROTONIX) 40 MG tablet Take 40 mg by mouth daily. 07/06/19   [provider]  predniSONE (DELTASONE) 20 MG tablet Take 60 mg daily x 2 days then 40 mg daily x 2 days then 20 mg daily x 2 days 05/05/21   Drenda Freeze, MD  Vitamin D, Ergocalciferol, (DRISDOL) 1.25 MG (50000 UNIT) CAPS capsule Take 50,000 Units by mouth once a week. 07/01/19   [provider]  ALBUTEROL IN Inhale into the lungs.  02/04/19  [provider]  cetirizine (ZYRTEC) 10 MG tablet Take 10 mg by mouth daily.  02/04/19  [provider]  fluticasone (FLONASE) 50 MCG/ACT nasal spray Place 1 spray into both nostrils daily. 05/29/17 02/04/19  Khatri, Nicanor Alcon, PA-C  furosemide (  LASIX) 20 MG tablet Take 2 tablets (40 mg total) by mouth daily. 10/07/16 02/04/19  Caccavale, Sophia, PA-C  loratadine (CLARITIN) 10 MG tablet Take 1 tablet (10 mg total) by mouth daily. 05/29/17 02/04/19  Khatri, Hina, PA-C  SUMAtriptan (IMITREX) 100 MG tablet Take 1 tablet (100 mg total) by mouth every 2 (two) hours as needed for migraine. May repeat in 2 hours if headache persists or recurs. 11/17/15 02/04/19  Dorie Rank, MD  Topiramate (TOPAMAX PO) Take by mouth.  02/04/19  [provider]      Allergies    Gabapentin and Ace inhibitors    Review of  Systems   Review of Systems  HENT:  Positive for ear pain.     Physical Exam Updated Vital Signs BP 108/78 (BP Location: Left Arm)   Pulse 76   Temp 98 F (36.7 C) (Oral)   Resp 18   Ht 5\' 5"  (1.651 m)   Wt 124.7 kg   LMP 06/16/2017   SpO2 100%   BMI 45.76 kg/m  Physical Exam Vitals and nursing note reviewed.  Constitutional:      General: She is not in acute distress.    Appearance: She is not ill-appearing.  HENT:     Head: Atraumatic.     Right Ear: No mastoid tenderness. Tympanic membrane is bulging.     Left Ear: No mastoid tenderness. Tympanic membrane is not bulging.     Ears:     Comments: Right eardrum is erythematous and bulging.  Negative ear tug, negative tragus test, negative mastoid tenderness.  External canal is mostly clear.  Scant amounts of residual otorrhea noted although overall appears to be healing well Eyes:     Conjunctiva/sclera: Conjunctivae normal.  Cardiovascular:     Rate and Rhythm: Normal rate and regular rhythm.     Pulses: Normal pulses.     Heart sounds: No murmur heard. Pulmonary:     Effort: Pulmonary effort is normal. No respiratory distress.     Breath sounds: Normal breath sounds.  Abdominal:     General: Abdomen is flat. There is no distension.     Palpations: Abdomen is soft.     Tenderness: There is no abdominal tenderness.  Musculoskeletal:        General: Normal range of motion.     Cervical back: Normal range of motion.  Skin:    General: Skin is warm and dry.     Capillary Refill: Capillary refill takes less than 2 seconds.  Neurological:     General: No focal deficit present.     Mental Status: She is alert.  Psychiatric:        Mood and Affect: Mood normal.     ED Results / Procedures / Treatments   Labs (all labs ordered are listed, but only abnormal results are displayed) Labs Reviewed - No data to display  EKG None  Radiology No results found.  Procedures Procedures    Medications Ordered in  ED Medications - No data to display  ED Course/ Medical Decision Making/ A&P                           Medical Decision Making Risk Prescription drug management.   51 year old female presents to the emergency department for evaluation of right ear pain that is resistant to Augmentin therapy.  Differentials include resistant AOM, EOM, mastoiditis, perforated eardrum.  Vitals without significant abnormality.  On exam, her right  eardrum is bulging and erythematous although external auditory canal is healing well.  Negative mastoid tenderness.  Given that patient is on day 7 of 10 of Augmentin without improvement, will plan to switch to cefdinir twice daily x10 days.  Advised patient that typically if antibiotic is effective, she will see improvement within 48 to 72 hours.  She notes that she was given an ENT referral by her PCP but unfortunately they would not be able to see her until July.  ENT was provided here in the emergency department.  Advised that if she does not have symptomatic improvement after 3 days she should follow-up with her PCP once again.  Continue to take Tylenol for discomfort. Final Clinical Impression(s) / ED Diagnoses Final diagnoses:  Recurrent acute suppurative otitis media of right ear without spontaneous rupture of tympanic membrane    Rx / DC Orders ED Discharge Orders          Ordered    cefdinir (OMNICEF) 300 MG capsule  2 times daily        08/25/21 1824              Tonye Pearson, PA-C 08/25/21 1900    Malvin Johns, MD 08/25/21 2042

## 2021-08-25 NOTE — ED Triage Notes (Signed)
Pt arrives pov, steady gait, c/o right ear pain x 1 week. Denies fever. Tx at UC x 2 with no improvement, ear drops rx from pcp with no improvement

## 2021-08-25 NOTE — Discharge Instructions (Addendum)
I have sent you in a different medication to try for your ear infection.  Please note that if you are not experiencing improvement in 48 to 72 hours, I would like you to follow-up with your PCP.  I have also given you an ENT referral-call them in the morning to see if you can appointment this week.  Continue using your eardrops as recommended.  Take Tylenol for pain.

## 2022-04-26 ENCOUNTER — Encounter (HOSPITAL_BASED_OUTPATIENT_CLINIC_OR_DEPARTMENT_OTHER): Payer: Self-pay | Admitting: Emergency Medicine

## 2022-04-26 ENCOUNTER — Other Ambulatory Visit: Payer: Self-pay

## 2022-04-26 ENCOUNTER — Emergency Department (HOSPITAL_BASED_OUTPATIENT_CLINIC_OR_DEPARTMENT_OTHER)
Admission: EM | Admit: 2022-04-26 | Discharge: 2022-04-26 | Disposition: A | Discharge status: Home / Self Care | Payer: BLUE CROSS/BLUE SHIELD | Attending: Emergency Medicine | Admitting: Emergency Medicine

## 2022-04-26 DIAGNOSIS — J45909 Unspecified asthma, uncomplicated: Secondary | ICD-10-CM | POA: Diagnosis not present

## 2022-04-26 DIAGNOSIS — U071 COVID-19: Secondary | ICD-10-CM | POA: Diagnosis not present

## 2022-04-26 DIAGNOSIS — I10 Essential (primary) hypertension: Secondary | ICD-10-CM | POA: Diagnosis not present

## 2022-04-26 DIAGNOSIS — J0101 Acute recurrent maxillary sinusitis: Secondary | ICD-10-CM | POA: Diagnosis not present

## 2022-04-26 DIAGNOSIS — R0981 Nasal congestion: Secondary | ICD-10-CM | POA: Diagnosis present

## 2022-04-26 LAB — RESP PANEL BY RT-PCR (RSV, FLU A&B, COVID)  RVPGX2
Influenza A by PCR: NEGATIVE
Influenza B by PCR: NEGATIVE
Resp Syncytial Virus by PCR: NEGATIVE
SARS Coronavirus 2 by RT PCR: POSITIVE — AB

## 2022-04-26 MED ORDER — MOLNUPIRAVIR EUA 200MG CAPSULE: 4.0000 | ORAL_CAPSULE | Freq: Two times a day (BID) | ORAL | 0 refills | Status: DC

## 2022-04-26 MED ORDER — PAXLOVID (300/100) 20 X 150 MG & 10 X 100MG PO TBPK: 3.0000 | ORAL_TABLET | Freq: Two times a day (BID) | ORAL | 0 refills | Status: AC

## 2022-04-26 MED ORDER — AZITHROMYCIN 250 MG PO TABS: 250.0000 mg | ORAL_TABLET | Freq: Every day | ORAL | 0 refills | Status: AC

## 2022-04-26 NOTE — ED Provider Notes (Signed)
Emergency Department Provider Note   I have reviewed the triage vital signs and the nursing notes.   HISTORY  Chief Complaint Nasal Congestion   HPI Kerry Sloan is a 52 y.o. female past history of asthma and hypertension presents emergency department with nasal congestion, face pain, and fatigue.  Symptoms over the past 3 to 4 days.  No chest pain or shortness of breath.  Has had congestion and sinus issues in the past and thinks she may be getting a sinus infection.  No vomiting or diarrhea.  No sick contacts.   Past Medical History:  Diagnosis Date   Asthma    GERD (gastroesophageal reflux disease)    Hypertension    Migraine     Review of Systems  Constitutional: No fever/chills Eyes: No visual changes. ENT: Positive congestion and face pressure.  Cardiovascular: Denies chest pain. Respiratory: Denies shortness of breath. Gastrointestinal: No abdominal pain.  No nausea, no vomiting.  No diarrhea.  ____________________________________________   PHYSICAL EXAM:  VITAL SIGNS: ED Triage Vitals  Enc Vitals Group     BP 04/26/22 1121 (!) 124/92     Pulse Rate 04/26/22 1121 80     Resp 04/26/22 1121 18     Temp 04/26/22 1121 98.2 F (36.8 C)     Temp Source 04/26/22 1121 Oral     SpO2 04/26/22 1121 100 %   Constitutional: Alert and oriented. Well appearing and in no acute distress. Eyes: Conjunctivae are normal. Head: Atraumatic. Ears:  Healthy appearing ear canals and TMs bilaterally Nose: Positive congestion/rhinnorhea. Mild tenderness over the maxillary sinuses.  Mouth/Throat: Mucous membranes are moist.  Oropharynx non-erythematous. Neck: No stridor.   Cardiovascular: Normal rate, regular rhythm. Good peripheral circulation. Grossly normal heart sounds.   Respiratory: Normal respiratory effort.  No retractions. Lungs CTAB. Gastrointestinal: No distention.  Musculoskeletal:  No gross deformities of extremities. Neurologic:  Normal speech and language.   Skin:  Skin is warm, dry and intact. No rash noted.  ____________________________________________   LABS (all labs ordered are listed, but only abnormal results are displayed)  Labs Reviewed  RESP PANEL BY RT-PCR (RSV, FLU A&B, COVID)  [id number] - Abnormal; Notable for the following components:      Result Value   SARS Coronavirus 2 by RT PCR POSITIVE (*)    All other components within normal limits    ____________________________________________   PROCEDURES  Procedure(s) performed:   Procedures  None  ____________________________________________   INITIAL IMPRESSION / ASSESSMENT AND PLAN / ED COURSE  Pertinent labs & imaging results that were available during my care of the patient were reviewed by me and considered in my medical decision making (see chart for details).   This patient is Presenting for Evaluation of congestion, which does require a range of treatment options, and is a complaint that involves a high risk of morbidity and mortality.  The Differential Diagnoses include COVID, Flu, CAP, sinusitis, etc.  Clinical Laboratory Tests Ordered, included COVID 19 positive.   Medical Decision Making: Summary:  She presents to the emergency department with cough and congestion.  Some face pain and tenderness.  She has a history of maxillary sinusitis and feels similar.  COVID test is also coming back positive, likely contributing to majority of her symptoms.  She does have some risk factors for worsening COVID symptoms including asthma.  Plan for molnupiravir and Z-Pak with close PCP follow up.    Patient's presentation is most consistent with acute illness / injury with system  symptoms.   Disposition: discharge  ____________________________________________  FINAL CLINICAL IMPRESSION(S) / ED DIAGNOSES  Final diagnoses:  Acute recurrent maxillary sinusitis  COVID-19     NEW OUTPATIENT MEDICATIONS STARTED DURING THIS VISIT:  New Prescriptions   AZITHROMYCIN  (ZITHROMAX) 250 MG TABLET    Take 1 tablet (250 mg total) by mouth daily. Take first 2 tablets together, then 1 every day until finished.   MOLNUPIRAVIR EUA (LAGEVRIO) 200 MG CAPS CAPSULE    Take 4 capsules (800 mg total) by mouth 2 (two) times daily for 5 days.    Note:  This document was prepared using Dragon voice recognition software and may include unintentional dictation errors.  Nanda Quinton, MD, [facility]    Kamren Heskett, [facility], MD 04/26/22 1230

## 2022-04-26 NOTE — Discharge Instructions (Addendum)
You may use Mucinex over-the-counter for the next week along with continuing your Flonase.  Please reach out to your ENT.  If you do not have 1, I have listed 1 on this form for you.  Return with any new or suddenly worsening symptoms.

## 2022-04-26 NOTE — ED Triage Notes (Signed)
Pt reports having nasal pressure on both sides of nose associated with intermittent runny nose. Denies other symptoms beside nasal congestions. Tried flonase and sudafed with no relief. Pt concerned for sinus infection.

## 2022-11-09 ENCOUNTER — Emergency Department (HOSPITAL_BASED_OUTPATIENT_CLINIC_OR_DEPARTMENT_OTHER): Payer: BLUE CROSS/BLUE SHIELD

## 2022-11-09 ENCOUNTER — Emergency Department (HOSPITAL_BASED_OUTPATIENT_CLINIC_OR_DEPARTMENT_OTHER)
Admission: EM | Admit: 2022-11-09 | Discharge: 2022-11-09 | Disposition: A | Discharge status: Home / Self Care | Payer: BLUE CROSS/BLUE SHIELD | Attending: Emergency Medicine | Admitting: Emergency Medicine

## 2022-11-09 ENCOUNTER — Other Ambulatory Visit: Payer: Self-pay

## 2022-11-09 ENCOUNTER — Encounter (HOSPITAL_BASED_OUTPATIENT_CLINIC_OR_DEPARTMENT_OTHER): Payer: Self-pay

## 2022-11-09 DIAGNOSIS — W010XXA Fall on same level from slipping, tripping and stumbling without subsequent striking against object, initial encounter: Secondary | ICD-10-CM | POA: Insufficient documentation

## 2022-11-09 DIAGNOSIS — M25511 Pain in right shoulder: Secondary | ICD-10-CM | POA: Diagnosis present

## 2022-11-09 DIAGNOSIS — J45909 Unspecified asthma, uncomplicated: Secondary | ICD-10-CM | POA: Diagnosis not present

## 2022-11-09 DIAGNOSIS — S43401A Unspecified sprain of right shoulder joint, initial encounter: Secondary | ICD-10-CM | POA: Insufficient documentation

## 2022-11-09 DIAGNOSIS — I10 Essential (primary) hypertension: Secondary | ICD-10-CM | POA: Insufficient documentation

## 2022-11-09 MED ORDER — METHOCARBAMOL 500 MG PO TABS: 500.0000 mg | ORAL_TABLET | Freq: Two times a day (BID) | ORAL | 0 refills | Status: AC

## 2022-11-09 NOTE — ED Triage Notes (Signed)
Yesterday the patient had a trip and fell. She is having right upper arm and shoulder pain. She is unable to lift that arm up without assistance. She denied head injury or LOC. No blood thinner use.

## 2022-11-09 NOTE — Discharge Instructions (Signed)
You are seen emergency department today for fall with shoulder injury.  As we discussed I think you sprained your right shoulder.  Your x-rays did not show any broken or dislocated bones.  I have given you a sling to use for comfort.  You can wear this while you are up and doing things, but I do not recommend wearing it 24/7.  You can take ibuprofen and/or Tylenol as needed for pain.  Have also written you prescription for a muscle relaxer.  This will not fix your pain, but may help you sleep better to let the muscles heal.  If your symptoms are no better within a week, I recommend following up with orthopedic doctor.  I attached the contact information for you to call and make a follow-up appointment.

## 2022-11-09 NOTE — ED Provider Notes (Signed)
[place] [facility] AT [facility] Provider Note   CSN: [id number] Arrival date & time: 11/09/22  4098     History  Chief Complaint  Patient presents with   Fall   Shoulder Injury    Kerry Sloan is a 52 y.o. female with history of hypertension, asthma, GERD, migraines, who presents to the emergency department complaining of right shoulder pain.  Patient states that yesterday she tripped and fell.  She stopped both of her arms out to brace her fall.  Ever since then she has been having right shoulder and right upper arm pain.  She is having difficulty lifting up the arm without assistance due to pain.  She denies any head injury or loss of consciousness.  She is not on blood thinners.  No sensation changes.   Fall  Shoulder Injury       Home Medications Prior to Admission medications   Medication Sig Start Date End Date Taking? Authorizing Provider  methocarbamol (ROBAXIN) 500 MG tablet Take 1 tablet (500 mg total) by mouth 2 (two) times daily. 11/09/22  Yes Corryn Madewell T, PA-C  ADVAIR HFA 45-21 MCG/ACT inhaler INHALE 2 PUFFS INTO THE LUNGS 2 TIMES DAILY FOR 30 DAYS 04/14/19   [provider]  albuterol (PROVENTIL HFA;VENTOLIN HFA) 108 (90 Base) MCG/ACT inhaler Inhale 1-2 puffs into the lungs every 6 (six) hours as needed for wheezing or shortness of breath. 05/29/17   Khatri, Hina, PA-C  azithromycin (ZITHROMAX) 250 MG tablet Take 1 tablet (250 mg total) by mouth daily. Take first 2 tablets together, then 1 every day until finished. 04/26/22   Long, Arlyss Repress, MD  diphenhydrAMINE (BENADRYL) 25 MG tablet Take 2 tablets every 6 hours as needed for lip swelling. 02/04/19   Molpus, John, MD  Elagolix Sodium (ORILISSA) 200 MG TABS Take 200 mg by mouth 2 (two) times daily. 01/17/19   [provider]  EPINEPHrine 0.3 mg/0.3 mL IJ SOAJ injection SMARTSIG:0.3 Milligram(s) IM Once PRN 04/07/19   [provider]  hydrochlorothiazide  (HYDRODIURIL) 25 MG tablet Take 25 mg by mouth daily. 04/07/19   [provider]  ibuprofen (ADVIL,MOTRIN) 600 MG tablet Take 1 tablet (600 mg total) by mouth every 6 (six) hours as needed. 03/08/18   Derwood Kaplan, MD  magnesium oxide (MAG-OX) 400 MG tablet Take by mouth. 07/01/19   [provider]  montelukast (SINGULAIR) 10 MG tablet Take 10 mg by mouth daily. 04/07/19   [provider]  moxifloxacin (VIGAMOX) 0.5 % ophthalmic solution 1-2 drops every 2 hours for 2 days THEN every 6 hours for 5 days 09/18/19   Cardama, Amadeo Garnet, MD  naphazoline-pheniramine (NAPHCON-A) 0.025-0.3 % ophthalmic solution Place 1 drop into the right eye every 4 (four) hours as needed for eye irritation. 07/30/19   Maxwell Caul, PA-C  omeprazole (PRILOSEC) 20 MG capsule Take 20 mg by mouth daily.    [provider]  pantoprazole (PROTONIX) 40 MG tablet Take 40 mg by mouth daily. 07/06/19   [provider]  predniSONE (DELTASONE) 20 MG tablet Take 60 mg daily x 2 days then 40 mg daily x 2 days then 20 mg daily x 2 days 05/05/21   Charlynne Pander, MD  Vitamin D, Ergocalciferol, (DRISDOL) 1.25 MG (50000 UNIT) CAPS capsule Take 50,000 Units by mouth once a week. 07/01/19   [provider]  ALBUTEROL IN Inhale into the lungs.  02/04/19  [provider]  cetirizine (ZYRTEC) 10 MG tablet  Take 10 mg by mouth daily.  02/04/19  [provider]  fluticasone (FLONASE) 50 MCG/ACT nasal spray Place 1 spray into both nostrils daily. 05/29/17 02/04/19  Khatri, Hina, PA-C  furosemide (LASIX) 20 MG tablet Take 2 tablets (40 mg total) by mouth daily. 10/07/16 02/04/19  Caccavale, Sophia, PA-C  loratadine (CLARITIN) 10 MG tablet Take 1 tablet (10 mg total) by mouth daily. 05/29/17 02/04/19  Khatri, Hina, PA-C  SUMAtriptan (IMITREX) 100 MG tablet Take 1 tablet (100 mg total) by mouth every 2 (two) hours as needed for migraine. May repeat in 2 hours if headache persists  or recurs. 11/17/15 02/04/19  Linwood Dibbles, MD  Topiramate (TOPAMAX PO) Take by mouth.  02/04/19  [provider]      Allergies    Gabapentin and Ace inhibitors    Review of Systems   Review of Systems  Musculoskeletal:  Positive for arthralgias.       Right shoulder/upper arm  All other systems reviewed and are negative.   Physical Exam Updated Vital Signs BP (!) 111/96   Pulse 76   Temp 98 F (36.7 C) (Oral)   Resp 18   Ht 5\' 5"  (1.651 m)   Wt 117.9 kg   LMP 06/16/2017   SpO2 100%   BMI 43.27 kg/m  Physical Exam Vitals and nursing note reviewed.  Constitutional:      Appearance: Normal appearance.  HENT:     Head: Normocephalic and atraumatic.  Eyes:     Conjunctiva/sclera: Conjunctivae normal.  Cardiovascular:     Pulses:          Radial pulses are 2+ on the right side and 2+ on the left side.  Pulmonary:     Effort: Pulmonary effort is normal. No respiratory distress.  Musculoskeletal:     Comments: No reproducible tenderness to palpation of the entirety of the right shoulder joint.  Normal passive range of motion, pain with active ROM, limiting shoulder flexion and abduction.  Normal grip strength.  Sensation intact.  Skin:    General: Skin is warm and dry.  Neurological:     Mental Status: She is alert.  Psychiatric:        Mood and Affect: Mood normal.        Behavior: Behavior normal.     ED Results / Procedures / Treatments   Labs (all labs ordered are listed, but only abnormal results are displayed) Labs Reviewed - No data to display  EKG None  Radiology DG Humerus Right  Result Date: 11/09/2022 CLINICAL DATA:  52 year old female with history of trauma from a fall complaining of right arm pain. EXAM: RIGHT HUMERUS - 2+ VIEW COMPARISON:  None Available. FINDINGS: There is no evidence of fracture or other focal bone lesions. Soft tissues are unremarkable. IMPRESSION: Negative. Electronically Signed   By: Trudie Reed M.D.   On:  11/09/2022 10:24   DG Shoulder Right  Result Date: 11/09/2022 CLINICAL DATA:  52 year old female with history of fall complaining of right shoulder pain. EXAM: RIGHT SHOULDER - 2+ VIEW COMPARISON:  No priors. FINDINGS: There is no evidence of fracture or dislocation. There is no evidence of arthropathy or other focal bone abnormality. Soft tissues are unremarkable. IMPRESSION: Negative. Electronically Signed   By: Trudie Reed M.D.   On: 11/09/2022 10:23    Procedures Procedures    Medications Ordered in ED Medications - No data to display  ED Course/ Medical Decision Making/ A&P  Medical Decision Making Amount and/or Complexity of Data Reviewed Radiology: ordered.  Risk Prescription drug management.   This patient is a 52 y.o. female  who presents to the ED for concern of R shoulder pain after injury.   Differential diagnoses prior to evaluation: The emergent differential diagnosis includes, but is not limited to, fracture, dislocation, ligamentous injury, vascular injury, nerve injury. This is not an exhaustive differential.   Past Medical History / Co-morbidities / Social History: hypertension, asthma, GERD, migraines  Physical Exam: Physical exam performed. The pertinent findings include: Normal vital signs, no acute distress.  No reproducible pain to palpation or manipulation of the right shoulder.  Normal passive ROM, however limited ROM due to pain to shoulder flexion and abduction.  Normal grip strength and sensation.  Normal radial pulse.  Lab Tests/Imaging studies: I personally interpreted labs/imaging and the pertinent results include: X-rays of the right shoulder and right humerus without acute abnormalities. I agree with the radiologist interpretation.  Disposition: After consideration of the diagnostic results and the patients response to treatment, I feel that emergency department workup does not suggest an emergent condition  requiring admission or immediate intervention beyond what has been performed at this time. The plan is: Discharged to home with symptomatic management of likely right shoulder sprain.  No evidence of vascular or nerve injury.  X-rays unremarkable.  Will give shoulder sling and patient requesting muscle relaxer.  Also encouraged NSAIDs.  Given orthopedic follow-up if symptoms do not improve.. The patient is safe for discharge and has been instructed to return immediately for worsening symptoms, change in symptoms or any other concerns.  Final Clinical Impression(s) / ED Diagnoses Final diagnoses:  Sprain of right shoulder, unspecified shoulder sprain type, initial encounter    Rx / DC Orders ED Discharge Orders          Ordered    methocarbamol (ROBAXIN) 500 MG tablet  2 times daily        11/09/22 1048           Portions of this report may have been transcribed using voice recognition software. Every effort was made to ensure accuracy; however, inadvertent computerized transcription errors may be present.    Jeanella Flattery 11/09/22 1052    Rolan Bucco, MD 11/09/22 1445
